# Patient Record
Sex: Male | Born: 1970
Health system: Southern US, Community
[De-identification: ages and names within clinical notes are randomized; demographics above are authoritative.]

## PROBLEM LIST (undated history)

## (undated) HISTORY — PX: HERNIA REPAIR: SHX51

---

## 1991-09-23 HISTORY — PX: CYST REMOVAL TRUNK: SHX6283

## 2010-10-30 ENCOUNTER — Emergency Department: Payer: Self-pay | Admitting: Unknown Physician Specialty

## 2013-01-31 ENCOUNTER — Encounter: Payer: Self-pay | Admitting: Adult Health

## 2013-01-31 ENCOUNTER — Ambulatory Visit (INDEPENDENT_AMBULATORY_CARE_PROVIDER_SITE_OTHER): Payer: BC Managed Care – PPO | Admitting: Adult Health

## 2013-01-31 VITALS — BP 110/68 | HR 74 | Temp 97.9°F | Resp 14 | Ht 67.5 in | Wt 199.5 lb

## 2013-01-31 DIAGNOSIS — R5381 Other malaise: Secondary | ICD-10-CM

## 2013-01-31 DIAGNOSIS — Z0000 Encounter for general adult medical examination without abnormal findings: Secondary | ICD-10-CM

## 2013-01-31 DIAGNOSIS — R5383 Other fatigue: Secondary | ICD-10-CM | POA: Insufficient documentation

## 2013-01-31 LAB — COMPREHENSIVE METABOLIC PANEL
ALT: 52 U/L (ref 0–53)
Albumin: 4.2 g/dL (ref 3.5–5.2)
CO2: 23 mEq/L (ref 19–32)
GFR: 90.94 mL/min (ref >60.00)
Glucose, Bld: 87 mg/dL (ref 70–99)
Potassium: 4.7 mEq/L (ref 3.5–5.1)
Sodium: 137 mEq/L (ref 135–145)
Total Bilirubin: 0.6 mg/dL (ref 0.3–1.2)
Total Protein: 7.5 g/dL (ref 6.0–8.3)

## 2013-01-31 LAB — CBC WITH DIFFERENTIAL/PLATELET
Basophils Absolute: 0 10*3/uL (ref 0.0–0.1)
Eosinophils Absolute: 0.1 10*3/uL (ref 0.0–0.7)
HCT: 45.3 % (ref 39.0–52.0)
Hemoglobin: 15.7 g/dL (ref 13.0–17.0)
Lymphocytes Relative: 23.1 % (ref 12.0–46.0)
Lymphs Abs: 1.3 10*3/uL (ref 0.7–4.0)
MCHC: 34.7 g/dL (ref 30.0–36.0)
Monocytes Relative: 10.1 % (ref 3.0–12.0)
Neutro Abs: 3.6 10*3/uL (ref 1.4–7.7)
Platelets: 204 10*3/uL (ref 150.0–400.0)
RDW: 13.7 % (ref 11.5–14.6)

## 2013-01-31 LAB — TSH: TSH: 1.08 u[IU]/mL (ref 0.35–5.50)

## 2013-01-31 LAB — LIPID PANEL
Cholesterol: 269 mg/dL — ABNORMAL HIGH (ref 0–200)
VLDL: 28.8 mg/dL (ref 0.0–40.0)

## 2013-01-31 NOTE — Patient Instructions (Addendum)
   Thank you for choosing [place] [facility] for your health care needs.  Please have your labs drawn prior to leaving the office.  The results will be available through MyChart for your convenience. Please remember to activate this. The activation code is located at the end of this form.  We will call you once your results are available.

## 2013-01-31 NOTE — Assessment & Plan Note (Signed)
Normal physical exam. Check routine labs: CBC with differential, metabolic panel, thyroid function and lipids.

## 2013-01-31 NOTE — Assessment & Plan Note (Signed)
Fatigue may be secondary to lack of sleep. Also discussed the possibility of sleep apnea as a possibility for fatigue or thyroid dysfunction, anemia. Check TSH and CBC.

## 2013-01-31 NOTE — Progress Notes (Signed)
Subjective:    Patient ID: Todd Miranda, male    DOB: 1971/06/28, 42 y.o.   MRN: [id number]  HPI  Patient is a 42 y/o male who presents to clinic to establish care. Patient has not had a primary care physician. Overall he is feeling quite healthy. He is experiencing some fatigue secondary to lack of sleep. The patient reports "I am unable to turn my brain off".  Family History  Problem Relation Age of Onset  . Cancer Mother     breast cancer     History   Social History  . Marital Status: Single    Spouse Name: N/A    Number of Children: N/A  . Years of Education: N/A   Occupational History  . Not on file.   Social History Main Topics  . Smoking status: Former Smoker    Quit date: 09/22/2005  . Smokeless tobacco: Never Used  . Alcohol Use: 3.6 oz/week    6 Cans of beer per week     Comment: 2-3 drinks per night  . Drug Use: No  . Sexually Active: Not on file   Other Topics Concern  . Not on file   Social History Narrative  . No narrative on file     Health Maintenance:   Tdap - 2011  Flu shot - Never has  Colonoscopy - N/A  Labs - needs  Depression Screen -   Tobacco Use - Remote tobacco. Quit 2007. 1ppd for 15 years  Dental Exams - Has not been  Vision Exam - yearly. [facility]  Exercise - No    Review of Systems  Constitutional: Positive for fatigue. Negative for fever and chills.  HENT: Negative.   Eyes: Negative.   Respiratory: Negative.   Cardiovascular: Negative.   Gastrointestinal: Positive for diarrhea. Negative for nausea and vomiting.       Diarrhea with consuming diet drinks  Endocrine: Negative.   Genitourinary: Negative for dysuria, frequency, hematuria, discharge, scrotal swelling, penile pain and testicular pain.  Musculoskeletal: Negative.   Skin: Negative.   Allergic/Immunologic: Negative for environmental allergies and food allergies.  Neurological: Negative.   Hematological: Negative.   Psychiatric/Behavioral:  Positive for sleep disturbance. Negative for suicidal ideas and self-injury. The patient is nervous/anxious.        Trouble sleeping.     BP 110/68  Pulse 74  Temp(Src) 97.9 F (36.6 C) (Oral)  Resp 14  Ht 5' 7.5" (1.715 m)  Wt 199 lb 8 oz (90.493 kg)  BMI 30.77 kg/m2  SpO2 97%    Objective:   Physical Exam  Constitutional: He is oriented to person, place, and time. He appears well-developed and well-nourished. No distress.  HENT:  Head: Normocephalic and atraumatic.  Right Ear: External ear normal.  Left Ear: External ear normal.  Nose: Nose normal.  Mouth/Throat: Oropharynx is clear and moist.  Eyes: Conjunctivae and EOM are normal. Pupils are equal, round, and reactive to light.  Neck: Normal range of motion. Neck supple. No thyromegaly present.  Cardiovascular: Normal rate, regular rhythm, normal heart sounds and intact distal pulses.  Exam reveals no gallop.   No murmur heard. Pulmonary/Chest: Effort normal and breath sounds normal. No respiratory distress. He has no wheezes. He has no rales. He exhibits no tenderness.  Abdominal: Soft. Bowel sounds are normal. He exhibits no distension and no mass. There is no tenderness. There is no rebound and no guarding.  Musculoskeletal: Normal range of motion. He exhibits no edema and no  tenderness.  Lymphadenopathy:    He has no cervical adenopathy.  Neurological: He is alert and oriented to person, place, and time. He has normal reflexes. No cranial nerve deficit. Coordination normal.  Skin: Skin is warm and dry.  Psychiatric: He has a normal mood and affect. His behavior is normal. Judgment and thought content normal.       Assessment & Plan:

## 2013-02-03 ENCOUNTER — Encounter: Payer: Self-pay | Admitting: *Deleted

## 2013-12-15 ENCOUNTER — Telehealth: Payer: Self-pay | Admitting: Adult Health

## 2013-12-15 NOTE — Telephone Encounter (Signed)
error 

## 2013-12-19 ENCOUNTER — Ambulatory Visit (INDEPENDENT_AMBULATORY_CARE_PROVIDER_SITE_OTHER): Payer: BC Managed Care – PPO | Admitting: Adult Health

## 2013-12-19 ENCOUNTER — Encounter (INDEPENDENT_AMBULATORY_CARE_PROVIDER_SITE_OTHER): Payer: Self-pay

## 2013-12-19 ENCOUNTER — Encounter: Payer: Self-pay | Admitting: Adult Health

## 2013-12-19 VITALS — BP 108/66 | HR 81 | Temp 98.0°F | Resp 12 | Wt 208.0 lb

## 2013-12-19 DIAGNOSIS — M25531 Pain in right wrist: Secondary | ICD-10-CM | POA: Insufficient documentation

## 2013-12-19 DIAGNOSIS — M25539 Pain in unspecified wrist: Secondary | ICD-10-CM

## 2013-12-19 DIAGNOSIS — F411 Generalized anxiety disorder: Secondary | ICD-10-CM

## 2013-12-19 DIAGNOSIS — F419 Anxiety disorder, unspecified: Secondary | ICD-10-CM | POA: Insufficient documentation

## 2013-12-19 MED ORDER — ALPRAZOLAM 0.5 MG PO TABS: 0.5000 mg | ORAL_TABLET | Freq: Every evening | ORAL | Status: DC | PRN

## 2013-12-19 NOTE — Progress Notes (Signed)
Pre visit review using our clinic review tool, if applicable. No additional management support is needed unless otherwise documented below in the visit note. 

## 2013-12-19 NOTE — Patient Instructions (Signed)
  For anxiety and trouble sleeping I am starting you on xanax 0.5 mg at bedtime as needed.  Please let me know if your symptoms are not improved within 1-2 weeks.  For your wrist pain:  Take ibuprofen 800 mg twice a day for the next 1-2 weeks. Take with food.  Apply ice alternating with heat to the affected areas for 15 min at a time. Do this approximately 3-4 times daily.  Wear a wrist splint to keep your wrist in a neutral position.  If no improvement within 1 month please let me know and I will refer you to Orthopedics for further evaluation.

## 2013-12-19 NOTE — Progress Notes (Signed)
Patient ID: Todd Miranda, male   DOB: 1971-06-24, 43 y.o.   MRN: [id number]    Subjective:    Patient ID: Todd RoutJulio C Miranda, male    DOB: 1971-06-24, 43 y.o.   MRN: [id number]  HPI  Pt is a very pleasant 43 y/o male who presents to clinic with a few concerns:  1) Anxiety. He is experiencing increasing stress at work and home. Reports being promoted at work. This is going well. He is having problems with a difficult employee. Feels he has support from his supervisor but still having difficulty communicating effectively with this employee. He is also having problems with his father who is currently living with him. Todd Miranda reports that he is trying to do the right thing with his father. He really never had a good relationship with his father. Reports that his father has always been a difficult person to live with. Todd Miranda left his home at age 43 because of issues with his dad. His father has also put added stress on [place]'s relationship with his girlfriend. They are currently taking some time off. This is also adding additional stress. His dad is getting ready to move to [place] with his daughter. Todd Miranda is having trouble sleeping.  2) Pain in his right wrist. He is right handed. His work with [employer] involves many repetitive movements. Todd Miranda reports a "shooting pain" on the pinky side of his wrist that travels to mid arm. He has been taking ibuprofen at bedtime.  Medical Hx: Non contributory  Current Outpatient Prescriptions on File Prior to Visit  Medication Sig Dispense Refill  . diphenhydrAMINE (BENADRYL) 25 MG tablet Take 25 mg by mouth at bedtime as needed for itching.       No current facility-administered medications on file prior to visit.    Review of Systems  Respiratory: Negative.   Cardiovascular: Negative for chest pain, palpitations and leg swelling.  Gastrointestinal: Negative.   Musculoskeletal:       Right wrist pain  Neurological: Numbness: tingling in right wrist.    Psychiatric/Behavioral: Positive for sleep disturbance. Negative for suicidal ideas, hallucinations, behavioral problems, confusion, self-injury, dysphoric mood, decreased concentration and agitation. The patient is nervous/anxious (Denies depression. Feels down 2/2 everything happening at once). The patient is not hyperactive.   All other systems reviewed and are negative.       Objective:  BP 108/66  Pulse 81  Temp(Src) 98 F (36.7 C) (Oral)  Resp 12  Wt 208 lb (94.348 kg)  SpO2 97%   Physical Exam  Constitutional: He is oriented to person, place, and time. He appears well-developed and well-nourished. No distress.  HENT:  Head: Normocephalic and atraumatic.  Cardiovascular: Normal rate, regular rhythm and normal heart sounds.  Exam reveals no gallop and no friction rub.   No murmur heard. Pulmonary/Chest: Effort normal and breath sounds normal. No respiratory distress. He has no wheezes. He has no rales.  Musculoskeletal: He exhibits no edema and no tenderness.  Positive phalen's test. Negative tinel's  Neurological: He is alert and oriented to person, place, and time. Coordination normal.  Skin: Skin is warm.  Psychiatric: He has a normal mood and affect. His behavior is normal. Judgment and thought content normal.  Expresses himself very well and engaging in conversation. Good eye contact.       Assessment & Plan:   1. Anxiety Multifactorial. Had long discussion with patient. Allowed time for him to "vent" and discuss what was troubling him. Discussed ways to minimize  stress such as exercise (not too close to bedtime), engaging in activities that interest him. Part of his stress is dealing with issues he cannot control. I will start him on xanax to help him relax and sleep. Discussed to use at bedtime, prn. Discussed addictive nature of this medication. If no improvement in 2 weeks we will consider adding an SSRI that may help with anxiety as well.  2. Wrist pain,  right Suspect may be carpal tunnel syndrome. Wrist splint to keep in neutral position. Ibuprofen bid for pain and inflammation. Ice. If no improvement within 1 month I will refer him to Ortho for further evaluation.

## 2014-01-16 ENCOUNTER — Encounter: Payer: Self-pay | Admitting: Adult Health

## 2014-01-16 ENCOUNTER — Ambulatory Visit (INDEPENDENT_AMBULATORY_CARE_PROVIDER_SITE_OTHER): Payer: BC Managed Care – PPO | Admitting: Adult Health

## 2014-01-16 VITALS — BP 112/68 | HR 75 | Temp 98.0°F | Resp 14 | Wt 199.0 lb

## 2014-01-16 DIAGNOSIS — F418 Other specified anxiety disorders: Secondary | ICD-10-CM

## 2014-01-16 DIAGNOSIS — F341 Dysthymic disorder: Secondary | ICD-10-CM

## 2014-01-16 DIAGNOSIS — M25539 Pain in unspecified wrist: Secondary | ICD-10-CM

## 2014-01-16 MED ORDER — FLUOXETINE HCL 20 MG PO TABS: 20.0000 mg | ORAL_TABLET | Freq: Every day | ORAL | Status: DC

## 2014-01-16 MED ORDER — BUSPIRONE HCL 7.5 MG PO TABS: 7.5000 mg | ORAL_TABLET | Freq: Two times a day (BID) | ORAL | Status: DC

## 2014-01-16 NOTE — Patient Instructions (Signed)
  Start Prozac 20 mg daily.  Start taking buspirone 7.5 mg twice a day (also called buspar).  You can continue using the xanax as needed at bedtime. Do not take unless you need.  I want to see you back for follow up in 1 month.  We will call you with an appointment for orthopedic

## 2014-01-16 NOTE — Progress Notes (Signed)
Pre visit review using our clinic review tool, if applicable. No additional management support is needed unless otherwise documented below in the visit note. 

## 2014-01-16 NOTE — Progress Notes (Signed)
Patient ID: Todd Miranda, male   DOB: [birth year], 43 y.o.   MRN: [id number]    Subjective:    Patient ID: Todd Miranda, male    DOB: [birth year], 43 y.o.   MRN: [id number]  HPI  Patient is a pleasant 43 year old male who presents to clinic for followup on anxiety. I saw him back on March 30 when he was experiencing increasing stress at work and home. He is still having considerable amount of stress at home. He recently drove his father to [place] to stay with his sister. His father had been a good portion of his anxiety. Todd Miranda also reports that the relationship with his girlfriend has ended. He was trying to work things out; however, his girlfriend thought that it would be best to part ways. He is having a difficult time with this. He is having difficulty sleeping and has been experiencing what is described as mild panic attacks. He reports that he had been taking 2 of his Xanax just to help him be able to relax to go to sleep. He feels that he needs something in addition to the Xanax because he feels that he is depressed.   He is still experiencing pain in his right wrist. He is right handed. His work with [employer] involves many repetitive movements. Todd Miranda reports a "shooting pain" on the pinky side of his wrist that travels to mid arm. He has been taking ibuprofen at bedtime. He has also been resting the area as much as possible.    No past medical history on file.  Current Outpatient Prescriptions on File Prior to Visit  Medication Sig Dispense Refill  . ALPRAZolam (XANAX) 0.5 MG tablet Take 1 tablet (0.5 mg total) by mouth at bedtime as needed for anxiety.  30 tablet  3  . diphenhydrAMINE (BENADRYL) 25 MG tablet Take 25 mg by mouth at bedtime as needed for itching.       No current facility-administered medications on file prior to visit.     Review of Systems  Constitutional: Negative.   HENT: Negative.   Eyes: Negative.   Respiratory: Negative.   Cardiovascular: Negative.    Gastrointestinal: Negative.   Endocrine: Negative.   Genitourinary: Negative.   Musculoskeletal: Positive for arthralgias (right wrist).  Skin: Negative.   Allergic/Immunologic: Negative.   Neurological: Negative.        Shooting pain right wrist  Hematological: Negative.   Psychiatric/Behavioral: Positive for sleep disturbance. Negative for suicidal ideas, behavioral problems, confusion, self-injury, decreased concentration and agitation. The patient is nervous/anxious (mild depression).        Objective:  BP 112/68  Pulse 75  Temp(Src) 98 F (36.7 C) (Oral)  Resp 14  Wt 199 lb (90.266 kg)  SpO2 97%   Physical Exam  Constitutional: He is oriented to person, place, and time. He appears well-developed and well-nourished. No distress.  HENT:  Head: Normocephalic and atraumatic.  Eyes: Conjunctivae and EOM are normal.  Neck: Neck supple.  Cardiovascular: Normal rate, regular rhythm, normal heart sounds and intact distal pulses.  Exam reveals no gallop and no friction rub.   No murmur heard. Pulmonary/Chest: Effort normal. No respiratory distress.  Musculoskeletal:  Pain in right wrist with flexion and extension. Also discomfort with rotation of the wrist (pronation, supination)   Neurological: He is alert and oriented to person, place, and time. Coordination normal.  Skin: Skin is warm and dry.  Psychiatric: He has a normal mood and affect. His behavior is normal. Judgment  and thought content normal.      Assessment & Plan:   1. Pain, wrist Suspect carpel tunnel given his work hx and repetitive movements. Refer to Ortho. Would like to stay in [place] if possible. - Ambulatory referral to Orthopedic Surgery  2. Depression w/anxiety He is still experiencing considerable amount of anxiety and now some panic attacks. Discussed starting BuSpar 7.5 mg twice a day. We can increase the dose if necessary. We can also increase the frequency if necessary as well. I will also start  him on Prozac 20 milligrams daily to help with depression. He can continue Xanax when necessary at bedtime. Discussed that I would prefer that he only takes the Xanax as needed. If he needs additional help with anxiety, as mentioned above, we can increase the BuSpar. Followup in one month.

## 2014-02-20 ENCOUNTER — Ambulatory Visit: Payer: BC Managed Care – PPO | Admitting: Adult Health

## 2014-02-27 ENCOUNTER — Encounter: Payer: Self-pay | Admitting: Adult Health

## 2014-02-27 ENCOUNTER — Ambulatory Visit (INDEPENDENT_AMBULATORY_CARE_PROVIDER_SITE_OTHER): Payer: BC Managed Care – PPO | Admitting: Adult Health

## 2014-02-27 VITALS — BP 118/72 | HR 80 | Temp 97.9°F | Resp 14 | Wt 183.0 lb

## 2014-02-27 DIAGNOSIS — G479 Sleep disorder, unspecified: Secondary | ICD-10-CM

## 2014-02-27 DIAGNOSIS — F341 Dysthymic disorder: Secondary | ICD-10-CM

## 2014-02-27 DIAGNOSIS — F418 Other specified anxiety disorders: Secondary | ICD-10-CM

## 2014-02-27 MED ORDER — TRAZODONE HCL 50 MG PO TABS: 25.0000 mg | ORAL_TABLET | Freq: Every evening | ORAL | Status: DC | PRN

## 2014-02-27 NOTE — Progress Notes (Signed)
Pre visit review using our clinic review tool, if applicable. No additional management support is needed unless otherwise documented below in the visit note. 

## 2014-02-27 NOTE — Patient Instructions (Addendum)
  Start Trazodone 50 mg at bedtime for sleep.  Continue the prozac and buspar as you have been doing.  Continue exercising as you have been doing.  Stay social.  Return in August for your physical exam. You will need to be fasting.

## 2014-02-27 NOTE — Progress Notes (Signed)
Patient ID: Todd Miranda, male   DOB: July 16, 1971, 43 y.o.   MRN: [id number]   Subjective:    Patient ID: Todd RoutJulio C Miranda, male    DOB: July 16, 1971, 43 y.o.   MRN: [id number]  HPI  Pt is a very pleasant 43 y/o male who presents to clinic for follow up depression with anxiety. He was seen 1 month ago and c/o experiencing considerable amount of anxiety and panic attacks. He was started on Buspar 7.5 mg twice a day. I also started him on Prozac 20 milligrams daily to help with depression.   He recently broke off a 2 year relationship with his ex-girlfriend. He reports that she has gotten everything out of his house. This was a hard time for Todd Miranda as he was trying to work things out. He reports that he saw her with another man approximately 1 month ago. He felt she was not honest with him and was possibly seeing another man while she was with him. He is slowly beginning to feel better. Has very supportive friends. He is staying active socially and is exercising regularly. He has changed his eating habits as well. He has lost 30 lbs. Physically, he feels the best he has in many years. Emotionally he feels he is significantly improvement.  Buspar is working. Prozac is also helping. Having some problems sleeping. He was taking xanax to help him sleep but he would prefer to try something else.  His father moved back to [place] and this has further decreased his stress level.    No past medical history on file.  Current Outpatient Prescriptions on File Prior to Visit  Medication Sig Dispense Refill  . ALPRAZolam (XANAX) 0.5 MG tablet Take 1 tablet (0.5 mg total) by mouth at bedtime as needed for anxiety.  30 tablet  3  . busPIRone (BUSPAR) 7.5 MG tablet Take 1 tablet (7.5 mg total) by mouth 2 (two) times daily.  60 tablet  5  . diphenhydrAMINE (BENADRYL) 25 MG tablet Take 25 mg by mouth at bedtime as needed for itching.      Marland Kitchen. FLUoxetine (PROZAC) 20 MG tablet Take 1 tablet (20 mg total) by mouth  daily.  30 tablet  3   No current facility-administered medications on file prior to visit.    Review of Systems  Constitutional: Negative.   HENT: Negative.   Eyes: Negative.   Respiratory: Negative.   Cardiovascular: Negative.   Gastrointestinal: Negative.   Endocrine: Negative.   Genitourinary: Negative.   Musculoskeletal: Negative.   Skin: Negative.   Allergic/Immunologic: Negative.   Neurological: Negative.   Hematological: Negative.   Psychiatric/Behavioral: Positive for sleep disturbance. Negative for suicidal ideas, behavioral problems, confusion, dysphoric mood and decreased concentration. The patient is nervous/anxious (improved with buspar).        Depression improved with prozac, exercise and staying social with friends       Objective:  BP 118/72  Pulse 80  Temp(Src) 97.9 F (36.6 C) (Oral)  Resp 14  Wt 183 lb (83.008 kg)  SpO2 97%   Physical Exam  Constitutional: He is oriented to person, place, and time. He appears well-developed and well-nourished. No distress.  Appears very calm. He has lost 30 lbs with exercise.  HENT:  Head: Normocephalic and atraumatic.  Eyes: Conjunctivae and EOM are normal.  Neck: Neck supple.  Cardiovascular: Normal rate and regular rhythm.   Pulmonary/Chest: Effort normal. No respiratory distress.  Musculoskeletal: Normal range of motion.  Neurological: He is alert  and oriented to person, place, and time.  Skin: Skin is warm and dry.  Psychiatric: He has a normal mood and affect. His behavior is normal. Judgment and thought content normal.  Good eye contact. Engaging in conversation      Assessment & Plan:   1. Depression with anxiety Doing well with Prozac and Buspar. Less anxious. He is staying active. Exercising regularly. Has very good support group of friends. Continue to follow. He will return for his yearly physical exam in August. Spent 30 min in face to face communication allowing him time to express his feelings  and discuss ways he has been managing his anxiety. It has been a tough time for him but he has found very healthy ways to help him cope.  2. Sleep disturbance Trazodone 50 mg at bedtime. He prefers to try something other than the xanax. He will let me know if trazodone is working. Continue to follow.

## 2014-05-01 ENCOUNTER — Encounter: Payer: BC Managed Care – PPO | Admitting: Adult Health

## 2014-08-09 ENCOUNTER — Other Ambulatory Visit: Payer: Self-pay | Admitting: *Deleted

## 2014-08-09 MED ORDER — FLUOXETINE HCL 20 MG PO TABS: 20.0000 mg | ORAL_TABLET | Freq: Every day | ORAL | Status: DC

## 2014-08-09 MED ORDER — BUSPIRONE HCL 7.5 MG PO TABS: 7.5000 mg | ORAL_TABLET | Freq: Two times a day (BID) | ORAL | Status: DC

## 2014-08-09 MED ORDER — TRAZODONE HCL 50 MG PO TABS: 25.0000 mg | ORAL_TABLET | Freq: Every evening | ORAL | Status: DC | PRN

## 2014-08-11 ENCOUNTER — Other Ambulatory Visit: Payer: Self-pay

## 2014-08-11 MED ORDER — FLUOXETINE HCL 20 MG PO TABS: 20.0000 mg | ORAL_TABLET | Freq: Every day | ORAL | Status: DC

## 2014-08-11 MED ORDER — BUSPIRONE HCL 7.5 MG PO TABS: 7.5000 mg | ORAL_TABLET | Freq: Two times a day (BID) | ORAL | Status: DC

## 2014-08-11 MED ORDER — TRAZODONE HCL 50 MG PO TABS: 25.0000 mg | ORAL_TABLET | Freq: Every evening | ORAL | Status: DC | PRN

## 2014-08-11 NOTE — Telephone Encounter (Signed)
Request for 90day supply for rx's sent by [facility]. Called [facility] to cancel the Rx's sent to them. Patient had not picked up Rx's yet so they were able to cancel them.

## 2014-08-28 ENCOUNTER — Encounter: Payer: BC Managed Care – PPO | Admitting: Nurse Practitioner

## 2014-10-02 ENCOUNTER — Encounter: Payer: Self-pay | Admitting: Nurse Practitioner

## 2014-10-02 ENCOUNTER — Ambulatory Visit (INDEPENDENT_AMBULATORY_CARE_PROVIDER_SITE_OTHER): Payer: BLUE CROSS/BLUE SHIELD | Admitting: Nurse Practitioner

## 2014-10-02 VITALS — BP 122/70 | HR 89 | Temp 98.2°F | Resp 12 | Ht 67.5 in | Wt 204.1 lb

## 2014-10-02 DIAGNOSIS — E669 Obesity, unspecified: Secondary | ICD-10-CM | POA: Insufficient documentation

## 2014-10-02 DIAGNOSIS — G479 Sleep disorder, unspecified: Secondary | ICD-10-CM

## 2014-10-02 DIAGNOSIS — Z0000 Encounter for general adult medical examination without abnormal findings: Secondary | ICD-10-CM

## 2014-10-02 DIAGNOSIS — F419 Anxiety disorder, unspecified: Secondary | ICD-10-CM

## 2014-10-02 DIAGNOSIS — M25531 Pain in right wrist: Secondary | ICD-10-CM

## 2014-10-02 LAB — TSH: TSH: 0.77 u[IU]/mL (ref 0.35–4.50)

## 2014-10-02 LAB — CBC WITH DIFFERENTIAL/PLATELET
BASOS PCT: 0.3 % (ref 0.0–3.0)
Basophils Absolute: 0 10*3/uL (ref 0.0–0.1)
EOS PCT: 1.9 % (ref 0.0–5.0)
Eosinophils Absolute: 0.1 10*3/uL (ref 0.0–0.7)
HEMATOCRIT: 42.2 % (ref 39.0–52.0)
Hemoglobin: 14.3 g/dL (ref 13.0–17.0)
LYMPHS ABS: 1 10*3/uL (ref 0.7–4.0)
Lymphocytes Relative: 17.7 % (ref 12.0–46.0)
MCHC: 33.8 g/dL (ref 30.0–36.0)
MCV: 89.7 fl (ref 78.0–100.0)
MONO ABS: 0.7 10*3/uL (ref 0.1–1.0)
MONOS PCT: 11.3 % (ref 3.0–12.0)
NEUTROS ABS: 4 10*3/uL (ref 1.4–7.7)
Neutrophils Relative %: 68.8 % (ref 43.0–77.0)
Platelets: 221 10*3/uL (ref 150.0–400.0)
RBC: 4.71 Mil/uL (ref 4.22–5.81)
RDW: 13.1 % (ref 11.5–15.5)
WBC: 5.8 10*3/uL (ref 4.0–10.5)

## 2014-10-02 MED ORDER — ALPRAZOLAM 0.5 MG PO TABS: ORAL_TABLET | ORAL | Status: DC

## 2014-10-02 MED ORDER — ALPRAZOLAM 0.5 MG PO TABS: 0.5000 mg | ORAL_TABLET | Freq: Every evening | ORAL | Status: DC | PRN

## 2014-10-02 NOTE — Progress Notes (Signed)
Subjective:    Patient ID: Todd Miranda, male    DOB: 1971-01-04, 44 y.o.   MRN: [id number]  HPI  Todd Miranda is a 44 yo male with a CC of left wrist pain and a refill for xanax for anxiety. He is here for his annual physical.   1) Left wrist pain around wrist x 2 weeks, painful when picking up weighted objects. Denies numbness/tingling, painful on radial side of wrist and thumb. Pt has brace at home, but it impedes ability to do job.   Ibuprofen- helpful   2)  Headaches- Chronic issue, happens on left side of head   Ibuprofen and eating helpful  3) Sleep disturbance- Trazadone 0.5 mg each night helpful  4) Anxiety- father being in the house triggers it, he has since gone to [place] and the anxiety is lessened, but still there.   5) Health Maintenance- Exercises- cardio 6 x a week for a hour starting 10/02/24 for 90 days -[employer] Diet- Follow diet plan through [employer].  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 A dentist.   Review of Systems  Constitutional: Negative for fever, chills, diaphoresis and fatigue.  HENT: Positive for trouble swallowing. Negative for tinnitus.        Dry mouth  Eyes: Negative for visual disturbance.  Respiratory: Negative for cough, chest tightness, shortness of breath and wheezing.   Cardiovascular: Negative for chest pain, palpitations and leg swelling.  Gastrointestinal: Negative for nausea, vomiting, diarrhea and constipation.  Musculoskeletal: Positive for arthralgias. Negative for back pain.  Skin: Negative for rash and wound.  Neurological: Negative for dizziness.  Psychiatric/Behavioral: Positive for decreased concentration. Negative for suicidal ideas and sleep disturbance. The patient is nervous/anxious.    No past medical history on file.  History   Social History  . Marital  Status: Single    Spouse Name: N/A    Number of Children: N/A  . Years of Education: N/A   Occupational History  . Not on file.   Social History Main Topics  . Smoking status: Former Smoker    Quit date: 09/22/2005  . Smokeless tobacco: Never Used  . Alcohol Use: 3.6 oz/week    6 Cans of beer per week     Comment: 2-3 drinks per night  . Drug Use: No  . Sexual Activity: Not on file   Other Topics Concern  . Not on file   Social History Narrative    Past Surgical History  Procedure Laterality Date  . Hernia repair      age 89 and 89  . Cyst removal trunk  1993    tailbone    Family History  Problem Relation Age of Onset  . Cancer Mother     breast cancer    No Known Allergies  Current Outpatient Prescriptions on File Prior to Visit  Medication Sig Dispense Refill  . ALPRAZolam (XANAX) 0.5 MG tablet Take 1 tablet (0.5 mg total) by mouth at bedtime as needed for anxiety. 30 tablet 3  . busPIRone (BUSPAR) 7.5 MG tablet Take 1 tablet (7.5 mg total) by mouth 2 (two) times daily. 180 tablet 0  . FLUoxetine (PROZAC) 20 MG tablet Take 1 tablet (20 mg total) by mouth daily. 90 tablet 0  . traZODone (DESYREL) 50 MG tablet Take 0.5-1 tablets (25-50 mg  total) by mouth at bedtime as needed for sleep. 90 tablet 0   No current facility-administered medications on file prior to visit.       Objective:   Physical Exam  Constitutional: He is oriented to person, place, and time. He appears well-developed and well-nourished. No distress.  HENT:  Head: Normocephalic and atraumatic.  Right Ear: External ear normal.  Left Ear: External ear normal.  Nose: Nose normal.  Mouth/Throat: Oropharynx is clear and moist. No oropharyngeal exudate.  Eyes: Conjunctivae and EOM are normal. Pupils are equal, round, and reactive to light. Right eye exhibits no discharge. Left eye exhibits no discharge. No scleral icterus.  Neck: Normal range of motion. Neck supple. No thyromegaly present.    Cardiovascular: Normal rate, regular rhythm, normal heart sounds and intact distal pulses.  Exam reveals no gallop and no friction rub.   No murmur heard. Pulmonary/Chest: Effort normal and breath sounds normal. No respiratory distress. He has no wheezes. He has no rales. He exhibits no tenderness.  Abdominal: Soft. Bowel sounds are normal. He exhibits no distension and no mass. There is no tenderness. There is no rebound and no guarding.  Genitourinary:  Deferred  Musculoskeletal: Normal range of motion. He exhibits tenderness. He exhibits no edema.  Left wrist positive finkelsteins.   Lymphadenopathy:    He has no cervical adenopathy.  Neurological: He is alert and oriented to person, place, and time. He displays normal reflexes. No cranial nerve deficit. He exhibits normal muscle tone. Coordination normal.  Skin: Skin is warm and dry. No rash noted. He is not diaphoretic.  Psychiatric: He has a normal mood and affect. His behavior is normal. Judgment and thought content normal.   BP 122/70 mmHg  Pulse 89  Temp(Src) 98.2 F (36.8 C) (Oral)  Resp 12  Ht 5' 7.5" (1.715 m)  Wt 204 lb 1.9 oz (92.588 kg)  BMI 31.48 kg/m2  SpO2 98%     Assessment & Plan:

## 2014-10-02 NOTE — Progress Notes (Signed)
Pre visit review using our clinic review tool, if applicable. No additional management support is needed unless otherwise documented below in the visit note. 

## 2014-10-02 NOTE — Patient Instructions (Signed)
Please visit the lab before leaving today.   De Quervain's Disease Suzette BattiestDe Quervain's disease is a condition often seen in racquet sports where there is a soreness (inflammation) in the cord like structures (tendons) which attach muscle to bone on the thumb side of the wrist. There may be a tightening of the tissuesaround the tendons. This condition is often helped by giving up or modifying the activity which caused it. When conservative treatment does not help, surgery may be required. Conservative treatment could include changes in the activity which brought about the problem or made it worse. Anti-inflammatory medications and injections may be used to help decrease the inflammation and help with pain control. Your caregiver will help you determine which is best for you. DIAGNOSIS  Often the diagnosis (learning what is wrong) can be made by examination. Sometimes x-rays are required. HOME CARE INSTRUCTIONS   Apply ice to the sore area for 15-20 minutes, 03-04 times per day while awake. Put the ice in a plastic bag and place a towel between the bag of ice and your skin. This is especially helpful if it can be done after all activities involving the sore wrist.  Temporary splinting may help.  Only take over-the-counter or prescription medicines for pain, discomfort or fever as directed by your caregiver. SEEK MEDICAL CARE IF:   Pain relief is not obtained with medications, or if you have increasing pain and seem to be getting worse rather than better. MAKE SURE YOU:   Understand these instructions.  Will watch your condition.  Will get help right away if you are not doing well or get worse. Document Released: 06/03/2001 Document Revised: 12/01/2011 Document Reviewed: 01/11/2014 [facility] Patient Information 2015 [employer] [place]. This information is not intended to replace advice given to you by your health care provider. Make sure you discuss any questions you have with your health care  provider.

## 2014-10-03 LAB — COMPREHENSIVE METABOLIC PANEL
ALT: 47 U/L (ref 0–53)
AST: 33 U/L (ref 0–37)
Albumin: 4.2 g/dL (ref 3.5–5.2)
Alkaline Phosphatase: 60 U/L (ref 39–117)
BUN: 21 mg/dL (ref 6–23)
CHLORIDE: 103 meq/L (ref 96–112)
CO2: 25 meq/L (ref 19–32)
Calcium: 9.4 mg/dL (ref 8.4–10.5)
Creatinine, Ser: 1.2 mg/dL (ref 0.4–1.5)
GFR: 74.11 mL/min (ref >60.00)
Glucose, Bld: 100 mg/dL — ABNORMAL HIGH (ref 70–99)
POTASSIUM: 4.2 meq/L (ref 3.5–5.1)
Sodium: 136 mEq/L (ref 135–145)
Total Bilirubin: 0.5 mg/dL (ref 0.2–1.2)
Total Protein: 7.1 g/dL (ref 6.0–8.3)

## 2014-10-03 LAB — LIPID PANEL
CHOL/HDL RATIO: 4
CHOLESTEROL: 292 mg/dL — AB (ref 0–200)
HDL: 66.5 mg/dL (ref >39.00)
NonHDL: 225.5
Triglycerides: 455 mg/dL — ABNORMAL HIGH (ref 0.0–149.0)
VLDL: 91 mg/dL — AB (ref 0.0–40.0)

## 2014-10-03 LAB — LDL CHOLESTEROL, DIRECT: Direct LDL: 157.5 mg/dL

## 2014-10-03 NOTE — Assessment & Plan Note (Signed)
Improving. Rx for alprazolam 0.5 mg once daily at bed time for anxiety. 1 prescription for Jan. And 1 for feb. With fill on or after dates. FU prn.

## 2014-10-03 NOTE — Assessment & Plan Note (Signed)
Stable, probable De Quervain's tenosynovitis. Pt given hand out with information about care. Rest, ice, and bracing when able. FU prn.

## 2014-10-03 NOTE — Assessment & Plan Note (Signed)
Stable- discussed plans for diet and exercise.

## 2014-10-03 NOTE — Assessment & Plan Note (Signed)
Obtain TSH, Lipid, CMET, CBC w/ diff today. FU prn.

## 2014-10-03 NOTE — Assessment & Plan Note (Signed)
Stable on trazadone 50 mg daily.

## 2014-10-04 ENCOUNTER — Other Ambulatory Visit: Payer: Self-pay | Admitting: Nurse Practitioner

## 2014-10-04 MED ORDER — ATORVASTATIN CALCIUM 10 MG PO TABS: 10.0000 mg | ORAL_TABLET | Freq: Every day | ORAL | Status: DC

## 2014-10-04 NOTE — Progress Notes (Signed)
Called patient and notified.  Pt verbalized understanding.

## 2014-12-11 ENCOUNTER — Other Ambulatory Visit: Payer: Self-pay | Admitting: Internal Medicine

## 2015-02-07 ENCOUNTER — Telehealth: Payer: Self-pay

## 2015-02-07 NOTE — Telephone Encounter (Signed)
PAtient was started on Alprazolam at last visit three months ago, he is requesting to have the dose increased to 1mg  from .5mg ? Please advise refill for dose?

## 2015-02-07 NOTE — Telephone Encounter (Signed)
The patient called and is hoping to get a refill of alprazolam  Pharmacy - [facility] in [place]  Pt callback [phone]- [phone]  (he is also hoping to increase the dosage to 1mg  instead .5

## 2015-02-08 NOTE — Telephone Encounter (Signed)
The patient called back hoping to get an update on this situation.

## 2015-02-08 NOTE — Telephone Encounter (Signed)
I will refill for Xanax 0.5 twice daily # 60 no refills. If he wants 1 mg he will have to come in and be seen. Thanks!

## 2015-02-09 ENCOUNTER — Other Ambulatory Visit: Payer: Self-pay

## 2015-02-09 ENCOUNTER — Other Ambulatory Visit: Payer: Self-pay | Admitting: Nurse Practitioner

## 2015-02-09 MED ORDER — ALPRAZOLAM 0.5 MG PO TABS: 0.5000 mg | ORAL_TABLET | Freq: Every evening | ORAL | Status: DC | PRN

## 2015-02-09 NOTE — Telephone Encounter (Signed)
Attempted to call patient regarding refill, not able to leave a VM as the mailbox was full.  [facility] for [facility] up.  If patient wants increased dose needs an appt with Lyla Sonarrie.

## 2015-02-13 NOTE — Telephone Encounter (Signed)
Patient returned call, patient requesting to increase dose of Xanax, explained he would need to see Lyla SonCarrie to do that, made appointment on 02/26/15 at 930 to follow up.

## 2015-02-26 ENCOUNTER — Encounter: Payer: Self-pay | Admitting: Nurse Practitioner

## 2015-02-26 ENCOUNTER — Ambulatory Visit (INDEPENDENT_AMBULATORY_CARE_PROVIDER_SITE_OTHER): Payer: BLUE CROSS/BLUE SHIELD | Admitting: Nurse Practitioner

## 2015-02-26 VITALS — BP 102/70 | HR 87 | Temp 97.5°F | Resp 12 | Ht 67.5 in | Wt 211.8 lb

## 2015-02-26 DIAGNOSIS — F418 Other specified anxiety disorders: Secondary | ICD-10-CM

## 2015-02-26 MED ORDER — ALPRAZOLAM 1 MG PO TABS: 1.0000 mg | ORAL_TABLET | Freq: Two times a day (BID) | ORAL | Status: DC | PRN

## 2015-02-26 NOTE — Patient Instructions (Addendum)
Please continue to follow up with your counselor. Follow up with me in 3 months for anxiety.   Do not hesitate to call us or the hotline if you have any thoughts again.

## 2015-02-26 NOTE — Progress Notes (Signed)
   Subjective:    Patient ID: Todd Miranda, male    DOB: 09-27-1970, 44 y.o.   MRN: [id number]  HPI  Todd Miranda is a 44 yo male with a CC of wanting alprazolam dosage increased.   1) Pt reports it was working well until about April. He is having a lot of family and work stressors. 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 He also reports his dad moved in around April and he is dealing with his depression. His father seems to be the biggest stressor since he is currently living with him.   He feels financial stress because he is helping his dad and sister financially. He recently had a relationship end because the partner reported he was not trusting nor "hiself around his father". He reports some "dark thoughts" and called a 1-800 hotline to seek care. He is getting [facility] up [facility]. He denies making a plan or any recent thoughts of suicide/homicide. He reports he has much to live for including being there for his friends.   He is taking 2 of the Xanax 0.5 mg at once to help "take the edge off" of his anxiety. He is also reporting smoking again and drinking liquor in the evenings. He denies destructive behaviors or "being black out drunk". He feels it helps him escape.   Review of Systems  Constitutional: Negative for fever, chills, diaphoresis and fatigue.  Psychiatric/Behavioral: Positive for sleep disturbance. Negative for suicidal ideas, hallucinations, behavioral problems, self-injury and agitation. The patient is nervous/anxious.       Objective:   Physical Exam  Constitutional: He appears well-developed and well-nourished. No distress.  BP 102/70 mmHg  Pulse 87  Temp(Src) 97.5 F (36.4 C) (Oral)  Resp 12  Wt 211 lb 12.8 oz (96.072 kg)  SpO2 97%   Skin: He is not diaphoretic.  Psychiatric: His speech is normal and behavior is normal. Judgment and thought content normal. His mood appears  anxious. His affect is not angry, not blunt, not labile and not inappropriate. Cognition and memory are normal. He exhibits a depressed mood.  He is tearful, but making appropriate eye contact and has insight into why he is stressed.       Assessment & Plan:   I spent 27 minutes face to face with the patient greater than 50% of time spent on coordination of care and counseling on anxiety and depression, medications, when to seek help, and obtaining counseling through psychology.

## 2015-02-26 NOTE — Assessment & Plan Note (Signed)
Recent worsening depression and anxiety. He is seeking counseling currently. He would like an increase in his xanax today since he is taking 1 mg daily himself. UDS obtained and script faxed to pharmacy. FU in 3 months or sooner if he wishes. He verbally agreed he will call us, a hotline, his counselor, or any other care if he has thoughts of suicide.

## 2015-02-26 NOTE — Progress Notes (Signed)
Pre visit review using our clinic review tool, if applicable. No additional management support is needed unless otherwise documented below in the visit note. 

## 2015-04-23 ENCOUNTER — Ambulatory Visit (INDEPENDENT_AMBULATORY_CARE_PROVIDER_SITE_OTHER): Payer: BLUE CROSS/BLUE SHIELD | Admitting: Nurse Practitioner

## 2015-04-23 ENCOUNTER — Encounter: Payer: Self-pay | Admitting: Nurse Practitioner

## 2015-04-23 ENCOUNTER — Encounter (INDEPENDENT_AMBULATORY_CARE_PROVIDER_SITE_OTHER): Payer: Self-pay

## 2015-04-23 VITALS — BP 110/78 | HR 78 | Temp 98.3°F | Resp 18 | Ht 68.0 in | Wt 203.4 lb

## 2015-04-23 DIAGNOSIS — B009 Herpesviral infection, unspecified: Secondary | ICD-10-CM

## 2015-04-23 MED ORDER — VALACYCLOVIR HCL 500 MG PO TABS: 500.0000 mg | ORAL_TABLET | Freq: Two times a day (BID) | ORAL | Status: DC

## 2015-04-23 NOTE — Patient Instructions (Signed)
At first sign of cold sore you can start the medication.   See you in Sept.

## 2015-04-23 NOTE — Assessment & Plan Note (Signed)
Pt has hx of HSV 1 with cold sores happening occasionally < 6 a year. Pt reports + for HSV 2 in past without outbreaks. He is using a condom with his new girlfriend, but understands any contact can cause an infection. We discussed treatment options and he would like to have episodic therapy. Valtrex 500 mg x 3 days at first sign of any outbreak. FU prn worsening/failure to improve.

## 2015-04-23 NOTE — Progress Notes (Signed)
Pre visit review using our clinic review tool, if applicable. No additional management support is needed unless otherwise documented below in the visit note. 

## 2015-04-23 NOTE — Progress Notes (Signed)
   Subjective:    Patient ID: Todd Miranda, male    DOB: [birth year], 44 y.o.   MRN: [id number]  HPI  Mr. Kant is a 44 yo male with a CC of HSV medication discussion.   1) HSV- history of HSV 1  New girlfriend and she had a scare and they had a discussion about his status. She tested negative for all HSV. He reports he was tested after his last girlfriend and was positive for HSV 2, but has never had an outbreak.   2) Down 8 lbs  Wt Readings from Last 3 Encounters:  04/23/15 203 lb 6.4 oz (92.262 kg)  02/26/15 211 lb 12.8 oz (96.072 kg)  10/02/14 204 lb 1.9 oz (92.588 kg)   Review of Systems  Constitutional: Negative for fever, chills, diaphoresis and fatigue.  Eyes: Negative for visual disturbance.  Respiratory: Negative for chest tightness, shortness of breath and wheezing.   Cardiovascular: Negative for chest pain, palpitations and leg swelling.  Neurological: Negative for dizziness, weakness and numbness.  Psychiatric/Behavioral: The patient is not nervous/anxious.       Objective:   Physical Exam  Constitutional: He is oriented to person, place, and time. He appears well-developed and well-nourished. No distress.  BP 110/78 mmHg  Pulse 78  Temp(Src) 98.3 F (36.8 C)  Resp 18  Ht  (1.727 m)  Wt 203 lb 6.4 oz (92.262 kg)  BMI 30.93 kg/m2  SpO2 96%   HENT:  Head: Normocephalic and atraumatic.  Right Ear: External ear normal.  Left Ear: External ear normal.  Neurological: He is alert and oriented to person, place, and time.  Skin: Skin is warm and dry. No rash noted. He is not diaphoretic.  Psychiatric: He has a normal mood and affect. His behavior is normal. Judgment and thought content normal.      Assessment & Plan:

## 2015-06-04 ENCOUNTER — Ambulatory Visit: Payer: BLUE CROSS/BLUE SHIELD | Admitting: Nurse Practitioner

## 2015-07-15 ENCOUNTER — Other Ambulatory Visit: Payer: Self-pay | Admitting: Nurse Practitioner

## 2015-07-16 NOTE — Telephone Encounter (Signed)
Last OV 8.1.16, Ov 9.12.16 was cancelled, no OV scheduled. Please advise refill

## 2015-07-28 ENCOUNTER — Other Ambulatory Visit: Payer: Self-pay | Admitting: Nurse Practitioner

## 2015-07-30 NOTE — Telephone Encounter (Signed)
Refill on Xanax 

## 2015-10-13 ENCOUNTER — Other Ambulatory Visit: Payer: Self-pay | Admitting: Nurse Practitioner

## 2015-10-13 NOTE — Telephone Encounter (Signed)
Okay to refill? Last seen in August. No future appt scheduled

## 2015-11-12 ENCOUNTER — Encounter: Payer: BLUE CROSS/BLUE SHIELD | Admitting: Nurse Practitioner

## 2015-11-30 ENCOUNTER — Encounter: Payer: 59 | Admitting: Nurse Practitioner

## 2015-12-10 ENCOUNTER — Encounter: Payer: 59 | Admitting: Nurse Practitioner

## 2016-01-07 ENCOUNTER — Encounter: Payer: Self-pay | Admitting: Nurse Practitioner

## 2016-01-07 ENCOUNTER — Ambulatory Visit (INDEPENDENT_AMBULATORY_CARE_PROVIDER_SITE_OTHER): Payer: Managed Care, Other (non HMO) | Admitting: Nurse Practitioner

## 2016-01-07 VITALS — BP 121/72 | HR 90 | Temp 98.3°F | Ht 68.0 in | Wt 219.0 lb

## 2016-01-07 DIAGNOSIS — F418 Other specified anxiety disorders: Secondary | ICD-10-CM

## 2016-01-07 MED ORDER — FLUOXETINE HCL 40 MG PO CAPS: 40.0000 mg | ORAL_CAPSULE | Freq: Every day | ORAL | Status: DC

## 2016-01-07 MED ORDER — ALPRAZOLAM 1 MG PO TABS: 1.0000 mg | ORAL_TABLET | Freq: Two times a day (BID) | ORAL | Status: DC | PRN

## 2016-01-07 MED ORDER — TRAZODONE HCL 50 MG PO TABS: ORAL_TABLET | ORAL | Status: DC

## 2016-01-07 MED ORDER — BUSPIRONE HCL 7.5 MG PO TABS: 7.5000 mg | ORAL_TABLET | Freq: Two times a day (BID) | ORAL | Status: DC

## 2016-01-07 NOTE — Patient Instructions (Signed)
See you in 1 week for a physical and fasting labs (nothing to eat or drink after midnight: except water or black coffee -no cream or sugar!)

## 2016-01-07 NOTE — Progress Notes (Signed)
Pre visit review using our clinic review tool, if applicable. No additional management support is needed unless otherwise documented below in the visit note. 

## 2016-01-07 NOTE — Progress Notes (Signed)
Patient ID: Todd Miranda, male    DOB: 02-Sep-1971  Age: 45 y.o. MRN: [id number]  CC: Annual Exam   HPI Todd RoutJulio C Brophy presents for follow up of anxiety.   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   History Todd Miranda has no past medical history on file.   He has past surgical history that includes Hernia repair and Cyst removal trunk (1993).   His family history includes Cancer in his mother.He reports that he has been smoking Cigarettes.  He started smoking about 12 months ago. He has been smoking about 1.00 pack per day. He has never used smokeless tobacco. He reports that he drinks about 3.6 oz of alcohol per week. He reports that he does not use illicit drugs.  Outpatient Prescriptions Prior to Visit  Medication Sig Dispense Refill  . atorvastatin (LIPITOR) 10 MG tablet Take 1 tablet (10 mg total) by mouth daily. 90 tablet 0  . valACYclovir (VALTREX) 500 MG tablet Take 1 tablet (500 mg total) by mouth 2 (two) times daily. 6 tablet 2  . ALPRAZolam (XANAX) 1 MG tablet TAKE ONE TABLET BY MOUTH TWICE DAILY AS NEEDED FOR ANXIETY 60 tablet 0  . busPIRone (BUSPAR) 7.5 MG tablet TAKE 1 TABLET TWICE A DAY 180 tablet 0  . FLUoxetine (PROZAC) 20 MG capsule TAKE 1 CAPSULE DAILY 90 capsule 0  . traZODone (DESYREL) 50 MG tablet TAKE ONE-HALF (1/2) TO ONE TABLET (25 TO 50 MG TOTAL) AT BEDTIME AS NEEDED FOR SLEEP 90 tablet 1   No facility-administered medications prior to visit.    ROS Review of Systems  Constitutional: Negative for fever, chills, diaphoresis and fatigue.  Eyes: Negative for visual disturbance.  Respiratory: Negative for chest tightness, shortness of breath and wheezing.   Cardiovascular: Negative for chest pain,  palpitations and leg swelling.  Gastrointestinal: Negative for nausea, vomiting and diarrhea.  Musculoskeletal: Negative for myalgias.  Skin: Negative for rash.  Neurological: Negative for dizziness, weakness, numbness and headaches.  Psychiatric/Behavioral: Positive for sleep disturbance. The patient is nervous/anxious.     Objective:  BP 121/72 mmHg  Pulse 90  Temp(Src) 98.3 F (36.8 C) (Oral)  Ht 5\' 8"  (1.727 m)  Wt 219 lb (99.338 kg)  BMI 33.31 kg/m2  SpO2 96%  Physical Exam  Constitutional: He is oriented to person, place, and time. He appears well-developed and well-nourished. No distress.  HENT:  Head: Normocephalic and atraumatic.  Right Ear: External ear normal.  Left Ear: External ear normal.  Nose: Nose normal.  Mouth/Throat: Oropharynx is clear and moist. No oropharyngeal exudate.  Eyes: Conjunctivae and EOM are normal. Pupils are equal, round, and reactive to light. Right eye exhibits no discharge. Left eye exhibits no discharge. No scleral icterus.  Neck: Normal range of motion. Neck supple. No thyromegaly present.  Cardiovascular: Normal rate, regular rhythm and normal heart sounds.   Pulmonary/Chest: Effort normal and breath sounds normal. No respiratory distress. He has no wheezes. He has no rales. He exhibits no tenderness.  Abdominal: Soft. Bowel sounds are normal. He exhibits no distension and no mass. There is no tenderness. There is no rebound and no guarding.  Musculoskeletal: Normal range of motion. He exhibits no edema  or tenderness.  Lymphadenopathy:    He has no cervical adenopathy.  Neurological: He is alert and oriented to person, place, and time. He displays normal reflexes. No cranial nerve deficit. He exhibits normal muscle tone. Coordination normal.  Skin: Skin is warm and dry. No rash noted. He is not diaphoretic.  Psychiatric: He has a normal mood and affect. His behavior is normal. Judgment and thought content normal.   Assessment & Plan:    Favor was seen today for annual exam.  Diagnoses and all orders for this visit:  Depression with anxiety  Other orders -     ALPRAZolam (XANAX) 1 MG tablet; Take 1 tablet (1 mg total) by mouth 2 (two) times daily as needed. for anxiety -     traZODone (DESYREL) 50 MG tablet; TAKE ONE-HALF (1/2) TO ONE TABLET (25 TO 50 MG TOTAL) AT BEDTIME AS NEEDED FOR SLEEP -     busPIRone (BUSPAR) 7.5 MG tablet; Take 1 tablet (7.5 mg total) by mouth 2 (two) times daily. -     FLUoxetine (PROZAC) 40 MG capsule; Take 1 capsule (40 mg total) by mouth daily.  I have discontinued Mr. Steagall FLUoxetine. I have also changed his ALPRAZolam and busPIRone. Additionally, I am having him start on FLUoxetine. Lastly, I am having him maintain his atorvastatin, valACYclovir, and traZODone.  Meds ordered this encounter  Medications  . ALPRAZolam (XANAX) 1 MG tablet    Sig: Take 1 tablet (1 mg total) by mouth 2 (two) times daily as needed. for anxiety    Dispense:  60 tablet    Refill:  1    Order Specific Question:  Supervising Provider    Answer:  Duncan Dull L [[id number]]  . traZODone (DESYREL) 50 MG tablet    Sig: TAKE ONE-HALF (1/2) TO ONE TABLET (25 TO 50 MG TOTAL) AT BEDTIME AS NEEDED FOR SLEEP    Dispense:  30 tablet    Refill:  0    Order Specific Question:  Supervising Provider    Answer:  Duncan Dull L [[id number]]  . busPIRone (BUSPAR) 7.5 MG tablet    Sig: Take 1 tablet (7.5 mg total) by mouth 2 (two) times daily.    Dispense:  180 tablet    Refill:  3    Order Specific Question:  Supervising Provider    Answer:  Duncan Dull L [[id number]]  . FLUoxetine (PROZAC) 40 MG capsule    Sig: Take 1 capsule (40 mg total) by mouth daily.    Dispense:  90 capsule    Refill:  3    Order Specific Question:  Supervising Provider    Answer:  Sherlene Shams [[id number]]     Follow-up: Return in about 1 week (around 01/14/2016) for Physical exam w/ labs 11:30 am spot .

## 2016-01-13 NOTE — Assessment & Plan Note (Signed)
Established- worsening  Denies SI/HI  Xanax, prozac and trazodone sent to pharmacy Upped prozac to 40 mg daily  FU in 1 week

## 2016-01-14 ENCOUNTER — Ambulatory Visit (INDEPENDENT_AMBULATORY_CARE_PROVIDER_SITE_OTHER): Payer: Managed Care, Other (non HMO) | Admitting: Nurse Practitioner

## 2016-01-14 ENCOUNTER — Encounter: Payer: Self-pay | Admitting: Nurse Practitioner

## 2016-01-14 VITALS — BP 110/64 | HR 76 | Temp 98.5°F | Resp 14 | Ht 68.0 in | Wt 221.2 lb

## 2016-01-14 DIAGNOSIS — R7989 Other specified abnormal findings of blood chemistry: Secondary | ICD-10-CM

## 2016-01-14 DIAGNOSIS — Z0000 Encounter for general adult medical examination without abnormal findings: Secondary | ICD-10-CM | POA: Diagnosis not present

## 2016-01-14 DIAGNOSIS — Z0001 Encounter for general adult medical examination with abnormal findings: Secondary | ICD-10-CM

## 2016-01-14 LAB — LIPID PANEL
CHOLESTEROL: 247 mg/dL — AB (ref 0–200)
HDL: 50.5 mg/dL (ref >39.00)
NonHDL: 196.43
TRIGLYCERIDES: 222 mg/dL — AB (ref 0.0–149.0)
Total CHOL/HDL Ratio: 5
VLDL: 44.4 mg/dL — ABNORMAL HIGH (ref 0.0–40.0)

## 2016-01-14 LAB — COMPREHENSIVE METABOLIC PANEL
ALBUMIN: 4.1 g/dL (ref 3.5–5.2)
ALT: 55 U/L — ABNORMAL HIGH (ref 0–53)
AST: 29 U/L (ref 0–37)
Alkaline Phosphatase: 75 U/L (ref 39–117)
BILIRUBIN TOTAL: 0.5 mg/dL (ref 0.2–1.2)
BUN: 12 mg/dL (ref 6–23)
CALCIUM: 9.1 mg/dL (ref 8.4–10.5)
CO2: 28 meq/L (ref 19–32)
CREATININE: 0.88 mg/dL (ref 0.40–1.50)
Chloride: 101 mEq/L (ref 96–112)
GFR: 100.31 mL/min (ref >60.00)
GLUCOSE: 96 mg/dL (ref 70–99)
Potassium: 4.5 mEq/L (ref 3.5–5.1)
Sodium: 137 mEq/L (ref 135–145)
TOTAL PROTEIN: 6.5 g/dL (ref 6.0–8.3)

## 2016-01-14 LAB — CBC WITH DIFFERENTIAL/PLATELET
BASOS ABS: 0 10*3/uL (ref 0.0–0.1)
Basophils Relative: 0.2 % (ref 0.0–3.0)
EOS ABS: 0.2 10*3/uL (ref 0.0–0.7)
Eosinophils Relative: 4.1 % (ref 0.0–5.0)
HCT: 42.6 % (ref 39.0–52.0)
Hemoglobin: 14.5 g/dL (ref 13.0–17.0)
LYMPHS ABS: 0.7 10*3/uL (ref 0.7–4.0)
LYMPHS PCT: 17.3 % (ref 12.0–46.0)
MCHC: 34 g/dL (ref 30.0–36.0)
MCV: 87.9 fl (ref 78.0–100.0)
Monocytes Absolute: 0.6 10*3/uL (ref 0.1–1.0)
Monocytes Relative: 15.7 % — ABNORMAL HIGH (ref 3.0–12.0)
NEUTROS ABS: 2.6 10*3/uL (ref 1.4–7.7)
NEUTROS PCT: 62.7 % (ref 43.0–77.0)
PLATELETS: 197 10*3/uL (ref 150.0–400.0)
RBC: 4.85 Mil/uL (ref 4.22–5.81)
RDW: 13.5 % (ref 11.5–15.5)
WBC: 4.1 10*3/uL (ref 4.0–10.5)

## 2016-01-14 LAB — TSH: TSH: 1.61 u[IU]/mL (ref 0.35–4.50)

## 2016-01-14 LAB — LDL CHOLESTEROL, DIRECT: Direct LDL: 158 mg/dL

## 2016-01-14 LAB — HEMOGLOBIN A1C: Hgb A1c MFr Bld: 5.4 % (ref 4.6–6.5)

## 2016-01-14 MED ORDER — FLUOXETINE HCL 40 MG PO CAPS: 40.0000 mg | ORAL_CAPSULE | Freq: Every day | ORAL | Status: DC

## 2016-01-14 NOTE — Assessment & Plan Note (Addendum)
Discussed acute and chronic issues. Reviewed health maintenance measures, PFSHx, and immunizations. Obtain routine labs TSH, Lipid panel, CBC w/ diff, A1c, and CMET.  Declines Tdap and HIV screening today

## 2016-01-14 NOTE — Progress Notes (Signed)
Patient ID: Todd Miranda, male    DOB: 1971-05-22  Age: 45 y.o. MRN: [id number]  CC: Annual Exam   HPI [name] presents for Annual Exam.   1) Annual Physical   Diet- No formal   Exercise- Running daily if weather is good   Immunizations- tdap declines  Eye Exam- Not UTD  Dental Exam- Not UTD  Labs- Fasting  Fall- 1 fall in the last year- young, tripped when inebriated   Depression- Neg.  Refills: Didn't receive prozac, [facility] to [facility]   History [name] has no past medical history on file.   He has past surgical history that includes Hernia repair and Cyst removal trunk (1993).   His family history includes Cancer in his mother.He reports that he has been smoking Cigarettes.  He started smoking about 12 months ago. He has been smoking about 1.00 pack per day. He has never used smokeless tobacco. He reports that he drinks about 3.6 oz of alcohol per week. He reports that he does not use illicit drugs.  Outpatient Prescriptions Prior to Visit  Medication Sig Dispense Refill  . ALPRAZolam (XANAX) 1 MG tablet Take 1 tablet (1 mg total) by mouth 2 (two) times daily as needed. for anxiety 60 tablet 1  . atorvastatin (LIPITOR) 10 MG tablet Take 1 tablet (10 mg total) by mouth daily. 90 tablet 0  . busPIRone (BUSPAR) 7.5 MG tablet Take 1 tablet (7.5 mg total) by mouth 2 (two) times daily. 180 tablet 3  . FLUoxetine (PROZAC) 40 MG capsule Take 1 capsule (40 mg total) by mouth daily. 90 capsule 3  . traZODone (DESYREL) 50 MG tablet TAKE ONE-HALF (1/2) TO ONE TABLET (25 TO 50 MG TOTAL) AT BEDTIME AS NEEDED FOR SLEEP 30 tablet 0  . valACYclovir (VALTREX) 500 MG tablet Take 1 tablet (500 mg total) by mouth 2 (two) times daily. (Patient not taking: Reported on 01/14/2016) 6 tablet 2   No facility-administered medications prior to visit.    ROS Review of Systems  Constitutional: Negative for fever, chills, diaphoresis and fatigue.  Eyes: Negative for visual disturbance.   Respiratory: Negative for chest tightness, shortness of breath and wheezing.   Cardiovascular: Negative for chest pain, palpitations and leg swelling.  Gastrointestinal: Negative for nausea, vomiting and diarrhea.  Musculoskeletal: Negative for myalgias.  Skin: Negative for rash.  Neurological: Negative for dizziness, weakness, numbness and headaches.    Objective:  BP 110/64 mmHg  Pulse 76  Temp(Src) 98.5 F (36.9 C) (Oral)  Resp 14  Ht  (1.727 m)  Wt 221 lb 3.2 oz (100.336 kg)  BMI 33.64 kg/m2  SpO2 96%  Physical Exam  Constitutional: He is oriented to person, place, and time. He appears well-developed and well-nourished. No distress.  HENT:  Head: Normocephalic and atraumatic.  Right Ear: External ear normal.  Left Ear: External ear normal.  Nose: Nose normal.  Mouth/Throat: Oropharynx is clear and moist. No oropharyngeal exudate.  TMs clear bilaterally  Eyes: Conjunctivae and EOM are normal. Pupils are equal, round, and reactive to light. Right eye exhibits no discharge. Left eye exhibits no discharge. No scleral icterus.  Neck: Normal range of motion. Neck supple. No thyromegaly present.  Cardiovascular: Normal rate, regular rhythm, normal heart sounds and intact distal pulses.  Exam reveals no gallop and no friction rub.   No murmur heard. Pulmonary/Chest: Effort normal and breath sounds normal. No respiratory distress. He has no wheezes. He has no rales. He exhibits no tenderness.  Abdominal: Soft. Bowel sounds are normal. He exhibits no distension and no mass. There is no tenderness. There is no rebound and no guarding.  Genitourinary:  Deferred due to lack of concerns  Musculoskeletal: Normal range of motion. He exhibits no edema or tenderness.  Lymphadenopathy:    He has no cervical adenopathy.  Neurological: He is alert and oriented to person, place, and time. He displays normal reflexes. No cranial nerve deficit. He exhibits normal muscle tone. Coordination  normal.  Skin: Skin is warm and dry. No rash noted. He is not diaphoretic.  Psychiatric: He has a normal mood and affect. His behavior is normal. Judgment and thought content normal.   Assessment & Plan:   There are no diagnoses linked to this encounter. I am having Mr. Jayme CloudGonzalez maintain his atorvastatin, valACYclovir, ALPRAZolam, traZODone, busPIRone, and FLUoxetine.  Meds ordered this encounter  Medications  . DISCONTD: FLUoxetine (PROZAC) 20 MG capsule    Sig:      Follow-up: No Follow-up on file.

## 2016-01-14 NOTE — Patient Instructions (Signed)

## 2016-03-26 ENCOUNTER — Other Ambulatory Visit: Payer: Self-pay | Admitting: Nurse Practitioner

## 2016-03-26 NOTE — Telephone Encounter (Signed)
Pt called about needing a refill for ALPRAZolam (XANAX) 1 MG tablet.   [facility] [address] [facility] [place], [place] - [id number]   Call pt @ [phone]. Thank you!

## 2016-03-26 NOTE — Telephone Encounter (Signed)
Please advise refill, is following up with you. thanks

## 2016-03-27 MED ORDER — ALPRAZOLAM 1 MG PO TABS: 1.0000 mg | ORAL_TABLET | Freq: Two times a day (BID) | ORAL | Status: DC | PRN

## 2016-03-27 NOTE — Telephone Encounter (Signed)
Refill given. Please fax. Patient should keep his appointment.

## 2016-04-14 ENCOUNTER — Encounter: Payer: Self-pay | Admitting: Family Medicine

## 2016-04-14 ENCOUNTER — Ambulatory Visit (INDEPENDENT_AMBULATORY_CARE_PROVIDER_SITE_OTHER): Payer: Managed Care, Other (non HMO) | Admitting: Family Medicine

## 2016-04-14 VITALS — BP 108/68 | HR 61 | Temp 97.9°F | Wt 193.4 lb

## 2016-04-14 DIAGNOSIS — F329 Major depressive disorder, single episode, unspecified: Secondary | ICD-10-CM

## 2016-04-14 DIAGNOSIS — E669 Obesity, unspecified: Secondary | ICD-10-CM | POA: Diagnosis not present

## 2016-04-14 DIAGNOSIS — K429 Umbilical hernia without obstruction or gangrene: Secondary | ICD-10-CM | POA: Insufficient documentation

## 2016-04-14 DIAGNOSIS — E785 Hyperlipidemia, unspecified: Secondary | ICD-10-CM | POA: Diagnosis not present

## 2016-04-14 DIAGNOSIS — F418 Other specified anxiety disorders: Secondary | ICD-10-CM

## 2016-04-14 DIAGNOSIS — F32A Depression, unspecified: Secondary | ICD-10-CM

## 2016-04-14 DIAGNOSIS — F419 Anxiety disorder, unspecified: Secondary | ICD-10-CM

## 2016-04-14 MED ORDER — TRAZODONE HCL 50 MG PO TABS: ORAL_TABLET | ORAL | 0 refills | Status: DC

## 2016-04-14 MED ORDER — TRAZODONE HCL 50 MG PO TABS: ORAL_TABLET | ORAL | 1 refills | Status: DC

## 2016-04-14 NOTE — Assessment & Plan Note (Signed)
Weight has decreased about 30 pounds in 3 months. Suspect this is the result of some combination of him altering his diet and increasing exercise and has depression. Lab work 3 months ago was relatively unremarkable for cause of this. Patient reports he has been trying to lose weight and feels better about his weight now. We will monitor at follow-up and if continues to lose large amounts of weight would consider further workup.

## 2016-04-14 NOTE — Assessment & Plan Note (Signed)
No signs of obstruction or strangulation. We will refer to general surgery for evaluation.

## 2016-04-14 NOTE — Assessment & Plan Note (Signed)
Discussed rechecking LDL today after working on diet and exercise are deferred to his next visit. He will continue diet and exercise. We'll check an LDL at his follow-up in a month.

## 2016-04-14 NOTE — Progress Notes (Signed)
Todd Alar, MD Phone: [phone]  Todd Miranda is a 45 y.o. male who presents today for follow-up.  Depression/anxiety: Patient notes these things have slightly worsened since his last visit. Any little thing will set off his anxiety. He feels depressed. Notes he has a lot going on with work and with his family. He feels lonely, though he does report he has friends. He has also started walking and jogging to see if that helps. On Prozac and notes the increased dose does help some. Also on BuSpar, trazodone, and Xanax. Did see a therapist previously though felt it was not productive. Had one episode of SI about a week ago where he felt as though he would be better off not being alive though had no intent or plan to harm himself. He spoke with friends regarding this and feels better at this time. No SI at this time. Does note he drinks alcohol on occasion and this is excessive when he does drink. Does not take the Xanax at the same time he drinks alcohol. Does note some loose stools with his anxiety and diet changes.  Obesity: Patient notes he has been trying to lose some weight by walking and jogging. He notes his work is physically active for the [employer] company. He is also changed his diet. Has been eating healthier with lots of fruits and vegetables. Drinking just water. Does note he does not have much of an appetite. No early satiety or sweats.  Umbilical hernia: Notes sometimes this is started to hurt. Has a sharp discomfort at the site of the hernia. Only occurs once in a while. Lasts briefly. Always reduces. Has daily bowel movements.  HYPERLIPIDEMIA Symptoms Chest pain on exertion:  No   Leg claudication:   No Not currently taking Lipitor. Has been working on diet and exercise as outlined above.  PMH: Smoker   ROS see history of present illness  Objective  Physical Exam Vitals:   04/14/16 0956  BP: 108/68  Pulse: 61  Temp: 97.9 F (36.6 C)    BP Readings from Last 3  Encounters:  04/14/16 108/68  01/14/16 110/64  01/07/16 121/72   Wt Readings from Last 3 Encounters:  04/14/16 193 lb 6 oz (87.7 kg)  01/14/16 221 lb 3.2 oz (100.3 kg)  01/07/16 219 lb (99.3 kg)    Physical Exam  Constitutional: No distress.  HENT:  Head: Normocephalic and atraumatic.  Cardiovascular: Normal rate, regular rhythm and normal heart sounds.   Pulmonary/Chest: Effort normal and breath sounds normal.  Abdominal: Soft. Bowel sounds are normal. He exhibits no distension. There is no tenderness. There is no rebound and no guarding.  Umbilical hernia noted on the right aspect of the umbilicus, this fully reduces when the patient is laying down, nontender  Neurological: He is alert. Gait normal.  Skin: Skin is warm and dry. He is not diaphoretic.  Psychiatric:  Mood depressed, affect flat     Assessment/Plan: Please see individual problem list.  Depression with anxiety Continues worsening. One episode of SI about a week ago. No current SI. No intent or plan to harm himself at that time. We will refill his trazodone. He will continue his current medication regimen. Given the worsening and his extensive medication list for this issue we will refer to psychiatry for further help in managing this issue. We will also have him see a psychologist. He is given return precautions. If develops any intent or plan to harm himself he will go to the  emergency room.  Obesity, BMI unknown Weight has decreased about 30 pounds in 3 months. Suspect this is the result of some combination of him altering his diet and increasing exercise and has depression. Lab work 3 months ago was relatively unremarkable for cause of this. Patient reports he has been trying to lose weight and feels better about his weight now. We will monitor at follow-up and if continues to lose large amounts of weight would consider further workup.  Umbilical hernia No signs of obstruction or strangulation. We will refer to  general surgery for evaluation.  Hyperlipidemia Discussed rechecking LDL today after working on diet and exercise are deferred to his next visit. He will continue diet and exercise. We'll check an LDL at his follow-up in a month.   Orders Placed This Encounter  Procedures  . Ambulatory referral to General Surgery    Referral Priority:   Routine    Referral Type:   Surgical    Referral Reason:   Specialty Services Required    Requested Specialty:   General Surgery    Number of Visits Requested:   1  . Ambulatory referral to Psychiatry    Referral Priority:   Routine    Referral Type:   Psychiatric    Referral Reason:   Specialty Services Required    Requested Specialty:   Psychiatry    Number of Visits Requested:   1    Meds ordered this encounter  Medications  . DISCONTD: traZODone (DESYREL) 50 MG tablet    Sig: TAKE ONE-HALF (1/2) TO ONE TABLET (25 TO 50 MG TOTAL) AT BEDTIME AS NEEDED FOR SLEEP    Dispense:  90 tablet    Refill:  1  . traZODone (DESYREL) 50 MG tablet    Sig: TAKE ONE-HALF (1/2) TO ONE TABLET (25 TO 50 MG TOTAL) AT BEDTIME AS NEEDED FOR SLEEP    Dispense:  30 tablet    Refill:  0    Todd Alar, MD [facility]

## 2016-04-14 NOTE — Patient Instructions (Signed)
Nice to see you. We will refer you to psychiatry and psychology for your depression and anxiety. Please continue to work on diet and exercise for your cholesterol. We will refer you to a general surgeon for your umbilical hernia. Please continue to watch your weight and diet and exercise. If you develop chest pain, worsening depression or anxiety, thoughts of harming yourself, intent or plan to harm herself, abdominal pain, inability to reduce her hernia, lack of bowel movements, or any new or changing symptoms please seek medical attention.

## 2016-04-14 NOTE — Assessment & Plan Note (Signed)
Continues worsening. One episode of SI about a week ago. No current SI. No intent or plan to harm himself at that time. We will refill his trazodone. He will continue his current medication regimen. Given the worsening and his extensive medication list for this issue we will refer to psychiatry for further help in managing this issue. We will also have him see a psychologist. He is given return precautions. If develops any intent or plan to harm himself he will go to the emergency room.

## 2016-04-18 ENCOUNTER — Encounter: Payer: Self-pay | Admitting: Family Medicine

## 2016-04-21 ENCOUNTER — Encounter: Payer: Self-pay | Admitting: *Deleted

## 2016-04-28 ENCOUNTER — Ambulatory Visit (INDEPENDENT_AMBULATORY_CARE_PROVIDER_SITE_OTHER): Payer: Managed Care, Other (non HMO) | Admitting: General Surgery

## 2016-04-28 ENCOUNTER — Encounter: Payer: Self-pay | Admitting: General Surgery

## 2016-04-28 VITALS — Ht 69.0 in | Wt 189.0 lb

## 2016-04-28 DIAGNOSIS — K429 Umbilical hernia without obstruction or gangrene: Secondary | ICD-10-CM | POA: Diagnosis not present

## 2016-04-28 NOTE — Progress Notes (Signed)
Patient ID: Todd Miranda [place], male   DOB: January 16, 1971, 45 y.o.   MRN: [id number]  Chief Complaint  Patient presents with  . Other    umbilical hernia    HPI Todd Miranda [place] is a 45 y.o. male here today for a evaluation of a umbilical hernia. Patient states he noticed this about four years ago. He states in the last three months the area has got bigger. He has recently lost 30 pounds due to stressors and believes that this is causing the hernia to look larger. There is some pain with activity. Patient has a history of bilateral inguinal hernias that were repaired when he was a child. I have reviewed the history of present illness with the patient.  HPI  History reviewed. No pertinent past medical history.  Past Surgical History:  Procedure Laterality Date  . CYST REMOVAL TRUNK  1993   tailbone  . HERNIA REPAIR Bilateral    age 89 and 7 inguinal henri    Family History  Problem Relation Age of Onset  . Cancer Mother     breast cancer    Social History Social History  Substance Use Topics  . Smoking status: Current Every Day Smoker    Packs/day: 1.00    Types: Cigarettes    Start date: 12/22/2014  . Smokeless tobacco: Never Used  . Alcohol use 3.6 oz/week    6 Cans of beer per week     Comment: Whiskey on the rocks 1 glass a night     No Known Allergies  Current Outpatient Prescriptions  Medication Sig Dispense Refill  . ALPRAZolam (XANAX) 1 MG tablet Take 1 tablet (1 mg total) by mouth 2 (two) times daily as needed. for anxiety 60 tablet 0  . atorvastatin (LIPITOR) 10 MG tablet Take 1 tablet (10 mg total) by mouth daily. 90 tablet 0  . busPIRone (BUSPAR) 7.5 MG tablet Take 1 tablet (7.5 mg total) by mouth 2 (two) times daily. 180 tablet 3  . FLUoxetine (PROZAC) 40 MG capsule Take 1 capsule (40 mg total) by mouth daily. 90 capsule 3  . traZODone (DESYREL) 50 MG tablet TAKE ONE-HALF (1/2) TO ONE TABLET (25 TO 50 MG TOTAL) AT BEDTIME AS NEEDED FOR SLEEP 30 tablet 0   No  current facility-administered medications for this visit.     Review of Systems Review of Systems  Constitutional: Negative.   Respiratory: Negative.   Cardiovascular: Negative.     Height 5\' 9"  (1.753 m), weight 189 lb (85.7 kg).  Physical Exam Physical Exam  Constitutional: He is oriented to person, place, and time. He appears well-developed and well-nourished.  Eyes: Conjunctivae are normal. No scleral icterus.  Neck: Neck supple.  Cardiovascular: Normal rate, regular rhythm and normal heart sounds.   Pulmonary/Chest: Effort normal and breath sounds normal.  Abdominal: Soft. Normal appearance and bowel sounds are normal. There is no hepatomegaly. There is no tenderness. A hernia (umbilical) is present. Hernia confirmed positive in the right inguinal area (small). Hernia confirmed negative in the left inguinal area. Ventral: umbilical hernia.  Lymphadenopathy:    He has no cervical adenopathy.  Neurological: He is alert and oriented to person, place, and time.  Skin: Skin is warm and dry.    Data Reviewed Notes reviewed  Assessment    Umbilical hernia, small right inguinal hernia    Plan    Open umbilical hernia repair. Right inguinal hernia is very small and asymptomatic - repair not indicated at this time. It  will be followed.  Hernia precautions and incarceration were discussed with the patient. If they develop symptoms of an incarcerated hernia, they were encouraged to seek prompt medical attention.  I have recommended repair of the hernia using mesh on an outpatient basis in the near future. The risk of infection was reviewed. The role of prosthetic mesh to minimize the risk of recurrence was reviewed.    This information has been scribed by Ples Specter CMA.    Valton Schwartz G 04/28/2016, 4:56 PM

## 2016-04-28 NOTE — Patient Instructions (Signed)
Hernia, Adult A hernia is the bulging of an organ or tissue through a weak spot in the muscles of the abdomen (abdominal wall). Hernias develop most often near the navel or groin. There are many kinds of hernias. Common kinds include:  Femoral hernia. This kind of hernia develops under the groin in the upper thigh area.  Inguinal hernia. This kind of hernia develops in the groin or scrotum.  Umbilical hernia. This kind of hernia develops near the navel.  Hiatal hernia. This kind of hernia causes part of the stomach to be pushed up into the chest.  Incisional hernia. This kind of hernia bulges through a scar from an abdominal surgery. CAUSES This condition may be caused by:  Heavy lifting.  Coughing over a long period of time.  Straining to have a bowel movement.  An incision made during an abdominal surgery.  A birth defect (congenital defect).  Excess weight or obesity.  Smoking.  Poor nutrition.  Cystic fibrosis.  Excess fluid in the abdomen.  Undescended testicles. SYMPTOMS Symptoms of a hernia include:  A lump on the abdomen. This is the first sign of a hernia. The lump may become more obvious with standing, straining, or coughing. It may get bigger over time if it is not treated or if the condition causing it is not treated.  Pain. A hernia is usually painless, but it may become painful over time if treatment is delayed. The pain is usually dull and may get worse with standing or lifting heavy objects. Sometimes a hernia gets tightly squeezed in the weak spot (strangulated) or stuck there (incarcerated) and causes additional symptoms. These symptoms may include:  Vomiting.  Nausea.  Constipation.  Irritability. DIAGNOSIS A hernia may be diagnosed with:  A physical exam. During the exam your health care provider may ask you to cough or to make a specific movement, because a hernia is usually more visible when you move.  Imaging tests. These can  include:  X-rays.  Ultrasound.  CT scan. TREATMENT A hernia that is small and painless may not need to be treated. A hernia that is large or painful may be treated with surgery. Inguinal hernias may be treated with surgery to prevent incarceration or strangulation. Strangulated hernias are always treated with surgery, because lack of blood to the trapped organ or tissue can cause it to die. Surgery to treat a hernia involves pushing the bulge back into place and repairing the weak part of the abdomen. HOME CARE INSTRUCTIONS  Avoid straining.  Do not lift anything heavier than 10 lb (4.5 kg).  Lift with your leg muscles, not your back muscles. This helps avoid strain.  When coughing, try to cough gently.  Prevent constipation. Constipation leads to straining with bowel movements, which can make a hernia worse or cause a hernia repair to break down. You can prevent constipation by:  Eating a high-fiber diet that includes plenty of fruits and vegetables.  Drinking enough fluids to keep your urine clear or pale yellow. Aim to drink 6-8 glasses of water per day.  Using a stool softener as directed by your health care provider.  Lose weight, if you are overweight.  Do not use any tobacco products, including cigarettes, chewing tobacco, or electronic cigarettes. If you need help quitting, ask your health care provider.  Keep all follow-up visits as directed by your health care provider. This is important. Your health care provider may need to monitor your condition. SEEK MEDICAL CARE IF:  You have   swelling, redness, and pain in the affected area.  Your bowel habits change. SEEK IMMEDIATE MEDICAL CARE IF:  You have a fever.  You have abdominal pain that is getting worse.  You feel nauseous or you vomit.  You cannot push the hernia back in place by gently pressing on it while you are lying down.  The hernia:  Changes in shape or size.  Is stuck outside the  abdomen.  Becomes discolored.  Feels hard or tender.   This information is not intended to replace advice given to you by your health care provider. Make sure you discuss any questions you have with your health care provider.   Document Released: 09/08/2005 Document Revised: 09/29/2014 Document Reviewed: 07/19/2014 Elsevier Interactive Patient Education 2016 Elsevier Inc[employer]  

## 2016-05-06 ENCOUNTER — Telehealth: Payer: Self-pay | Admitting: General Surgery

## 2016-05-06 NOTE — Telephone Encounter (Signed)
I CALLED PT & L/M @ 12:48PM TO RET CALL. I NEED TO OBTAIN THE INFORMATION FOR HIS DPR.(FORM ON MY DESK)/MTH

## 2016-06-02 ENCOUNTER — Ambulatory Visit: Payer: Managed Care, Other (non HMO) | Admitting: Family Medicine

## 2016-06-02 DIAGNOSIS — Z0289 Encounter for other administrative examinations: Secondary | ICD-10-CM

## 2016-09-01 ENCOUNTER — Ambulatory Visit (INDEPENDENT_AMBULATORY_CARE_PROVIDER_SITE_OTHER): Payer: Managed Care, Other (non HMO) | Admitting: Family Medicine

## 2016-09-01 ENCOUNTER — Encounter: Payer: Self-pay | Admitting: Family Medicine

## 2016-09-01 VITALS — BP 98/70 | HR 70 | Temp 97.9°F | Wt 205.0 lb

## 2016-09-01 DIAGNOSIS — K429 Umbilical hernia without obstruction or gangrene: Secondary | ICD-10-CM | POA: Diagnosis not present

## 2016-09-01 DIAGNOSIS — R197 Diarrhea, unspecified: Secondary | ICD-10-CM

## 2016-09-01 DIAGNOSIS — F418 Other specified anxiety disorders: Secondary | ICD-10-CM

## 2016-09-01 DIAGNOSIS — E6609 Other obesity due to excess calories: Secondary | ICD-10-CM | POA: Diagnosis not present

## 2016-09-01 LAB — COMPREHENSIVE METABOLIC PANEL
ALK PHOS: 64 U/L (ref 39–117)
ALT: 58 U/L — AB (ref 0–53)
AST: 28 U/L (ref 0–37)
Albumin: 4.2 g/dL (ref 3.5–5.2)
BILIRUBIN TOTAL: 0.7 mg/dL (ref 0.2–1.2)
BUN: 12 mg/dL (ref 6–23)
CO2: 27 mEq/L (ref 19–32)
Calcium: 9.4 mg/dL (ref 8.4–10.5)
Chloride: 102 mEq/L (ref 96–112)
Creatinine, Ser: 0.88 mg/dL (ref 0.40–1.50)
GFR: 100.01 mL/min (ref >60.00)
GLUCOSE: 99 mg/dL (ref 70–99)
Potassium: 4.1 mEq/L (ref 3.5–5.1)
Sodium: 139 mEq/L (ref 135–145)
TOTAL PROTEIN: 6.5 g/dL (ref 6.0–8.3)

## 2016-09-01 LAB — LDL CHOLESTEROL, DIRECT: Direct LDL: 182 mg/dL

## 2016-09-01 NOTE — Progress Notes (Signed)
Pre visit review using our clinic review tool, if applicable. No additional management support is needed unless otherwise documented below in the visit note. 

## 2016-09-01 NOTE — Patient Instructions (Addendum)
Nice to see you. Please discuss whether or not you can see [facility] for your anxiety and depression. If your company will allow this we can place a referral. We will get you back in to see the surgeon for your umbilical hernia. Please continue to work on diet and exercise. If you develop intent or plan to harm yourself, abdominal pain, your hernia becomes stuck or painful, or any new or changing symptoms please seek medical attention immediately.

## 2016-09-02 DIAGNOSIS — R197 Diarrhea, unspecified: Secondary | ICD-10-CM | POA: Insufficient documentation

## 2016-09-02 NOTE — Progress Notes (Signed)
[name], MD Phone: [phone]  Todd Miranda is a 45 y.o. male who presents today for follow-up.  Depression/anxiety: Patient notes this is not very good. Notes he feels quite depressed and anxious. He feels as though he just does not want to be here though has no plans or intent to harm himself. Notes no HI. He just feels lonely. Is currently taking trazodone, Prozac, and BuSpar. He's been out of Xanax for last 2 months. Drinks alcohol about a fifth a week. Hasn't seen psychiatry or psychology yet. Patient has been on short-term disability through his work given these issues.  Depression screen [facility] 2/9 09/02/2016 01/14/2016  Decreased Interest 2 0  Down, Depressed, Hopeless 2 0  PHQ - 2 Score 4 0  Altered sleeping 3 -  Tired, decreased energy 0 -  Change in appetite 2 -  Feeling bad or failure about yourself  3 -  Trouble concentrating 1 -  Moving slowly or fidgety/restless 2 -  Suicidal thoughts 3 -  PHQ-9 Score 18 -  Difficult doing work/chores Not difficult at all -   GAD 7 : Generalized Anxiety Score 09/02/2016  Nervous, Anxious, on Edge 2  Control/stop worrying 3  Worry too much - different things 3  Trouble relaxing 3  Restless 2  Easily annoyed or irritable 0  Afraid - awful might happen 1  Total GAD 7 Score 14    Umbilical hernia: Patient notes no pain. Does not get stuck. He saw Gen. surgery and they wanted to do surgery. He never followed up with this. He has regular bowel movements.  Does have liquid stools. Was on antibiotics previously for dental procedure. No blood in the stool. No abdominal pain. Notes his liquid stools the patient on what he eats.   Obesity: Notes he pretty much eats whatever he wants. Does eat fairly healthfully though he eats a lot of food. Not much exercise.  PMH: Smoker   ROS see history of present illness  Objective  Physical Exam Vitals:   09/01/16 1046  BP: 98/70  Pulse: 70  Temp: 97.9 F (36.6 C)    BP  Readings from Last 3 Encounters:  09/01/16 98/70  04/14/16 108/68  01/14/16 110/64   Wt Readings from Last 3 Encounters:  09/01/16 205 lb (93 kg)  04/28/16 189 lb (85.7 kg)  04/14/16 193 lb 6 oz (87.7 kg)    Physical Exam  Constitutional: No distress.  Cardiovascular: Normal rate, regular rhythm and normal heart sounds.   Pulmonary/Chest: Effort normal and breath sounds normal.  Abdominal: Soft. Bowel sounds are normal. He exhibits no distension. There is no tenderness. There is no rebound and no guarding.  Umbilical hernia noted which is fully reducible and nontender  Neurological: He is alert. Gait normal.  Skin: He is not diaphoretic.  Psychiatric:  Mood depressed, affect depressed     Assessment/Plan: Please see individual problem list.  Depression with anxiety Worsened recently. Patient does note thoughts that he would be better off not being here though has no intent or plan to harm himself. Contracted for safety today. He is working through his company to see if they will cover a therapist and a psychiatrist. I encouraged him to check with them to see if he could see our therapist in the office. He will let us know if he is able to do this. A referral will be placed once he knows the answer to this question. He is given return precautions.  Umbilical hernia Asymptomatic  currently. No signs of incarceration or strangulation. We will get him back in to see general surgery to reconsider surgery for this. Patient is given hernia return precautions.  Obesity, BMI unknown Encouraged diet and exercise.  Diarrhea Suspect intermittent loose stools are likely related to anxiety and IBS. Benign abdominal exam today. No other symptoms with this. Given his prior antibiotic exposure we will check stool studies as outlined below. He is given return precautions.   Orders Placed This Encounter  Procedures  . Stool C-Diff Toxin Assay    Standing Status:   Future    Standing Expiration  Date:   09/01/2017  . Comp Met (CMET)  . Direct LDL  . Gastrointestinal Pathogen Panel PCR    Standing Status:   Future    Standing Expiration Date:   09/01/2017    [name], MD [place]

## 2016-09-02 NOTE — Assessment & Plan Note (Signed)
Encouraged diet and exercise.  

## 2016-09-02 NOTE — Assessment & Plan Note (Signed)
Worsened recently. Patient does note thoughts that he would be better off not being here though has no intent or plan to harm himself. Contracted for safety today. He is working through his company to see if they will cover a therapist and a psychiatrist. I encouraged him to check with them to see if he could see our therapist in the office. He will let us know if he is able to do this. A referral will be placed once he knows the answer to this question. He is given return precautions.

## 2016-09-02 NOTE — Assessment & Plan Note (Signed)
Suspect intermittent loose stools are likely related to anxiety and IBS. Benign abdominal exam today. No other symptoms with this. Given his prior antibiotic exposure we will check stool studies as outlined below. He is given return precautions.

## 2016-09-02 NOTE — Assessment & Plan Note (Signed)
Asymptomatic currently. No signs of incarceration or strangulation. We will get him back in to see general surgery to reconsider surgery for this. Patient is given hernia return precautions.

## 2016-09-03 ENCOUNTER — Other Ambulatory Visit (INDEPENDENT_AMBULATORY_CARE_PROVIDER_SITE_OTHER): Payer: Managed Care, Other (non HMO)

## 2016-09-03 ENCOUNTER — Other Ambulatory Visit: Payer: Self-pay | Admitting: Family Medicine

## 2016-09-03 DIAGNOSIS — R197 Diarrhea, unspecified: Secondary | ICD-10-CM | POA: Diagnosis not present

## 2016-09-04 ENCOUNTER — Telehealth: Payer: Self-pay | Admitting: Family Medicine

## 2016-09-04 ENCOUNTER — Other Ambulatory Visit: Payer: Self-pay | Admitting: Family Medicine

## 2016-09-04 ENCOUNTER — Telehealth: Payer: Self-pay | Admitting: Internal Medicine

## 2016-09-04 ENCOUNTER — Telehealth: Payer: Self-pay | Admitting: Radiology

## 2016-09-04 LAB — GASTROINTESTINAL PATHOGEN PANEL PCR
C. difficile Tox A/B, PCR: DETECTED — CR
Campylobacter, PCR: NOT DETECTED
Cryptosporidium, PCR: NOT DETECTED
E COLI (ETEC) LT/ST, PCR: NOT DETECTED
E COLI (STEC) STX1/STX2, PCR: NOT DETECTED
E COLI 0157, PCR: NOT DETECTED
Giardia lamblia, PCR: NOT DETECTED
NOROVIRUS, PCR: NOT DETECTED
Rotavirus A, PCR: NOT DETECTED
SALMONELLA, PCR: NOT DETECTED
SHIGELLA, PCR: NOT DETECTED

## 2016-09-04 LAB — C. DIFFICILE GDH AND TOXIN A/B
C. difficile GDH: DETECTED — AB
C. difficile Toxin A/B: NOT DETECTED

## 2016-09-04 LAB — CLOSTRIDIUM DIFFICILE BY PCR: Toxigenic C. Difficile by PCR: DETECTED — CR

## 2016-09-04 MED ORDER — VANCOMYCIN HCL 125 MG PO CAPS: 125.0000 mg | ORAL_CAPSULE | Freq: Four times a day (QID) | ORAL | 0 refills | Status: DC

## 2016-09-04 NOTE — Telephone Encounter (Signed)
Opened in error. Please see other phone note from today.

## 2016-09-04 NOTE — Telephone Encounter (Signed)
Rx sent in for PO Vanc.

## 2016-09-04 NOTE — Telephone Encounter (Signed)
I have contacted the health department and left a message. I have also contacted a personal contact in infectious disease,without giving patient information, for further advise . Per health department recommendation she informed me that unfortunately the certified letter is the only thing to do at this point.

## 2016-09-04 NOTE — Telephone Encounter (Signed)
I have tried to call patient. Patients number is disconnected as well as the patients sister who is his emergency contact. I tried calling patients other emergency contact who is his significant other. I have called the patients pharmacy to see if they have any other numbers listed for the patient which they do not. They informed me also that the patient did not sign up for alerts and therefore will not be contacted once the prescription is filled. I have been unable to get in touch with the patient and will send out a letter to call the office.

## 2016-09-04 NOTE — Telephone Encounter (Signed)
[facility] called for critical lab result. Verified for positive C-Diff. Dr. Adriana Simasook notified.

## 2016-09-04 NOTE — Telephone Encounter (Signed)
FYI.  Received a call from [facility] regarding critical lab - positive.  Reported positive c. diff.   I tried to get in touch with patient.  Unable to reach.  Tried emergency contact.  Unable to reach.  Reviewed chart and unsuccessful attempts at trying to reach pt.  (it appears you are aware of positive test c. diff).

## 2016-09-04 NOTE — Telephone Encounter (Addendum)
Thanks for your help and work on this. I additionally tried to call both of the patients numbers and found the same issue. Given that you have not been able to get in contact with the patient I would suggest contacting the health department to see if this is a reportable issue, that way they can track the patient down if it is something that is with in their scope. You might check with Harriett Sineancy to see if this is an option.

## 2016-09-05 NOTE — Telephone Encounter (Signed)
I am aware. They tried multiple times yesterday to contact the patient with no luck. It appears that they contacted the health department and they advised sending a certified letter. Certified letter has already been sent.

## 2016-09-08 MED ORDER — ATORVASTATIN CALCIUM 40 MG PO TABS: 40.0000 mg | ORAL_TABLET | Freq: Every day | ORAL | 2 refills | Status: DC

## 2016-09-08 NOTE — Telephone Encounter (Signed)
Patient called back. CMA informed of lab results and C. difficile results. Advised to pick vancomycin up per CMA. Lipitor sent to the pharmacy. CMA also informed patient to clean things with bleach solution around the house to limit reinfection.

## 2016-09-08 NOTE — Addendum Note (Signed)
Addended by: Birdie SonsSONNENBERG, Carlie Corpus G on: 09/08/2016 12:50 PM   Modules accepted: Orders

## 2016-09-08 NOTE — Telephone Encounter (Signed)
Patient notified of lab results and states he has not been taking Lipitor. Please send rx to [facility] pharmacy.patient informed to avoid direct contact with children and elderly and to wash hands to prevent spread of infection.

## 2016-09-11 ENCOUNTER — Telehealth: Payer: Self-pay | Admitting: Family Medicine

## 2016-09-11 NOTE — Telephone Encounter (Signed)
Pt dropped off FMLA paper work. These papers need to be completed and submitted. Papers are up front in color folder.

## 2016-09-11 NOTE — Telephone Encounter (Signed)
noted 

## 2016-09-24 ENCOUNTER — Telehealth: Payer: Self-pay | Admitting: *Deleted

## 2016-09-24 DIAGNOSIS — Z7689 Persons encountering health services in other specified circumstances: Secondary | ICD-10-CM

## 2016-09-24 NOTE — Telephone Encounter (Signed)
Is this ready?

## 2016-09-24 NOTE — Telephone Encounter (Signed)
Patient has requested a update on this paperwork  Please call pt at [phone]

## 2016-09-24 NOTE — Telephone Encounter (Signed)
Paper work has been completed. I am releasing the patient back to work per our discussion at his office visit. Please confirm this with the patient. Thanks.

## 2016-09-24 NOTE — Telephone Encounter (Signed)
Patient notified, from faxed per patient request and patient has picked up hard copy

## 2016-10-13 ENCOUNTER — Other Ambulatory Visit: Payer: Managed Care, Other (non HMO)

## 2016-10-29 ENCOUNTER — Ambulatory Visit: Payer: Managed Care, Other (non HMO) | Admitting: Family Medicine

## 2016-12-14 ENCOUNTER — Other Ambulatory Visit: Payer: Self-pay | Admitting: Nurse Practitioner

## 2017-01-01 ENCOUNTER — Other Ambulatory Visit: Payer: Self-pay | Admitting: Nurse Practitioner

## 2017-06-18 ENCOUNTER — Ambulatory Visit (INDEPENDENT_AMBULATORY_CARE_PROVIDER_SITE_OTHER): Payer: BLUE CROSS/BLUE SHIELD | Admitting: Family Medicine

## 2017-06-18 ENCOUNTER — Encounter: Payer: Self-pay | Admitting: Family Medicine

## 2017-06-18 ENCOUNTER — Telehealth: Payer: Self-pay | Admitting: Radiology

## 2017-06-18 VITALS — BP 128/70 | HR 81 | Temp 97.6°F | Resp 16 | Ht 67.5 in | Wt 201.6 lb

## 2017-06-18 DIAGNOSIS — R197 Diarrhea, unspecified: Secondary | ICD-10-CM | POA: Diagnosis not present

## 2017-06-18 DIAGNOSIS — E669 Obesity, unspecified: Secondary | ICD-10-CM | POA: Diagnosis not present

## 2017-06-18 DIAGNOSIS — M25551 Pain in right hip: Secondary | ICD-10-CM | POA: Diagnosis not present

## 2017-06-18 DIAGNOSIS — Z13220 Encounter for screening for lipoid disorders: Secondary | ICD-10-CM

## 2017-06-18 DIAGNOSIS — Z0001 Encounter for general adult medical examination with abnormal findings: Secondary | ICD-10-CM | POA: Diagnosis not present

## 2017-06-18 DIAGNOSIS — K429 Umbilical hernia without obstruction or gangrene: Secondary | ICD-10-CM | POA: Diagnosis not present

## 2017-06-18 DIAGNOSIS — F418 Other specified anxiety disorders: Secondary | ICD-10-CM | POA: Diagnosis not present

## 2017-06-18 DIAGNOSIS — E66811 Obesity, class 1: Secondary | ICD-10-CM

## 2017-06-18 NOTE — Telephone Encounter (Signed)
Pt got sample for C-Diff send off test. PT is taking home ifob 3 day test. Please place future order for ifob test. Thank you.

## 2017-06-18 NOTE — Assessment & Plan Note (Signed)
Suspect trochanteric bursitis. He can ice and sparingly taking anti-inflammatories. Given stretches. If not improving could refer for an injection.

## 2017-06-18 NOTE — Assessment & Plan Note (Signed)
Physical exam completed. Encouraged continued activity and diet changes. Tetanus vaccination given. Encouraged to monitor alcohol intake. Patient deferred flu shot.

## 2017-06-18 NOTE — Assessment & Plan Note (Addendum)
Continues to have issues with loose stools. Possibly IBS in nature. Benign abdominal exam. He does have a history of C. difficile and thus we will check this today. He will also complete FOBT test at home given dark stools. He'll continue to monitor.

## 2017-06-18 NOTE — Progress Notes (Signed)
[name], MD Phone: [phone]  Todd Miranda is a 46 y.o. male who presents today for physical exam.  Stays active by being on his feet for a 10,000 steps a day at work. Tries to eat fairly healthily. Cooks healthy foods. He's cut down on his soda. Not much fast food. Due for tetanus vaccination. Declines flu shot. Reports prior HIV testing. Family history of breast cancer though no prostate or colon cancer. He is drinking socially which is improved from prior when he is drinking daily and up to a fifth of liquor per week. Occasional marijuana use though no other illicit drug use. No longer smoking. Quit cold [place] 2 months ago.  Anxiety/depression: Notes it's there but it's significantly better. He weaned himself off of everything and feels as though the medication was making things worse. He is doing great coming off of it. He is more reasonable and calm. No SI or HI.  He reports intermittent diarrhea since being treated for C. difficile last year. He's also had some dark colored stools. No tarry stools. No bright red blood. No abdominal pain. His stools are greasy at times. Has some bowel urgency.  He notes his right hip hurts as well for the last couple of days. He notes some discomfort with walking. Notes it's a sharp pain laterally. No specific injury.  Active Ambulatory Problems    Diagnosis Date Noted  . Fatigue 01/31/2013  . Anxiety 12/19/2013  . Depression with anxiety 01/16/2014  . Sleep disturbance 02/27/2014  . Obesity, BMI unknown 10/02/2014  . HSV-1 infection 04/23/2015  . Encounter for general adult medical examination with abnormal findings 01/14/2016  . Umbilical hernia 45/80/9983  . Hyperlipidemia 04/14/2016  . Diarrhea 09/02/2016  . Right hip pain 06/18/2017   Resolved Ambulatory Problems    Diagnosis Date Noted  . General medical exam 01/31/2013  . Wrist pain, right 12/19/2013  . Pain, wrist 01/16/2014   No Additional Past Medical History     Family History  Problem Relation Age of Onset  . Cancer Mother        breast cancer    Social History   Social History  . Marital status: Single    Spouse name: N/A  . Number of children: N/A  . Years of education: N/A   Occupational History  . Not on file.   Social History Main Topics  . Smoking status: Current Every Day Smoker    Packs/day: 1.00    Types: Cigarettes    Start date: 12/22/2014  . Smokeless tobacco: Never Used  . Alcohol use 3.6 oz/week    6 Cans of beer per week     Comment: Whiskey on the rocks 1 glass a night   . Drug use: No  . Sexual activity: Yes    Partners: Female     Comment: 1 partner currently    Other Topics Concern  . Not on file   Social History Narrative   Works for time Com[employer].        ROS  General:  Negative for nexplained weight loss, fever Skin: Negative for new or changing mole, sore that won't heal HEENT: Negative for trouble hearing, trouble seeing, ringing in ears, mouth sores, hoarseness, change in voice, dysphagia. CV:  Negative for chest pain, dyspnea, edema, palpitations Resp: Negative for cough, dyspnea, hemoptysis GI: Positive for diarrhea, dark colored stools, Negative for nausea, vomiting, constipation, abdominal pain, melena, hematochezia. GU: Negative for dysuria, incontinence, urinary hesitance, hematuria, vaginal or penile  discharge, polyuria, sexual difficulty, lumps in testicle or breasts MSK: Negative for muscle cramps or aches, positive joint pain or swelling Neuro: Negative for headaches, weakness, numbness, dizziness, passing out/fainting Psych: Positive for depression, anxiety, negative for memory problems  Objective  Physical Exam Vitals:   06/18/17 0914  BP: 128/70  Pulse: 81  Resp: 16  Temp: 97.6 F (36.4 C)  SpO2: 98%    BP Readings from Last 3 Encounters:  06/18/17 128/70  09/01/16 98/70  04/14/16 108/68   Wt Readings from Last 3 Encounters:  06/18/17 201 lb 9.6 oz (91.4 kg)   09/01/16 205 lb (93 kg)  04/28/16 189 lb (85.7 kg)    Physical Exam  Constitutional: No distress.  HENT:  Head: Normocephalic and atraumatic.  Mouth/Throat: Oropharynx is clear and moist. No oropharyngeal exudate.  Eyes: Conjunctivae are normal.  Cardiovascular: Normal rate, regular rhythm and normal heart sounds.   Pulmonary/Chest: Effort normal and breath sounds normal.  Abdominal: Soft. Bowel sounds are normal. He exhibits no distension. There is no tenderness. There is no rebound and no guarding.  Genitourinary:  Genitourinary Comments: External rectum normal, digital rectal exam difficult to complete given patient clamping down on his rectum  Musculoskeletal: He exhibits no edema.  Tender over the right greater trochanter, discomfort on internal rotation of the right hip, no discomfort on palpation of the left hip or other ranges of motion in the right or left hip  Neurological: He is alert. Gait normal.  Skin: Skin is warm and dry. He is not diaphoretic.   Umbilical hernia that is fully reducible and nontender noted  Assessment/Plan:   Encounter for general adult medical examination with abnormal findings Physical exam completed. Encouraged continued activity and diet changes. Tetanus vaccination given. Encouraged to monitor alcohol intake. Patient deferred flu shot.  Umbilical hernia No recurrent issues with this. Patient will monitor. Given hernia return precautions.  Diarrhea Continues to have issues with loose stools. Possibly IBS in nature. Benign abdominal exam. He does have a history of C. difficile and thus we will check this today. He will also complete FOBT test at home given dark stools. He'll continue to monitor.  Depression with anxiety Quite a bit improved. No SI or HI. He does not want to see a therapist or be on medication. He'll contact us if this worsens. Given return precautions.  Right hip pain Suspect trochanteric bursitis. He can ice and sparingly  taking anti-inflammatories. Given stretches. If not improving could refer for an injection.   Orders Placed This Encounter  Procedures  . Fecal occult blood, imunochemical    Standing Status:   Future    Standing Expiration Date:   06/18/2018  . Lipid panel    Standing Status:   Future    Standing Expiration Date:   06/18/2018  . Comp Met (CMET)    Standing Status:   Future    Standing Expiration Date:   06/18/2018  . HgB A1c    Standing Status:   Future    Standing Expiration Date:   06/18/2018  . C. difficile GDH and Toxin A/B    Standing Status:   Future    Number of Occurrences:   1    Standing Expiration Date:   06/18/2018    No orders of the defined types were placed in this encounter.    [name], MD Jack

## 2017-06-18 NOTE — Patient Instructions (Addendum)
Nice to see you. Please continue to work on staying active and work on your[employer] Congratulations on quitting smoking. Please monitor your anxiety and depression and if it worsens please let us know. If you develop thoughts of harming yourself or others please seek medical attention immediately. We are going to check your stool for blood. We'll also check for infection. We'll contact her with the results.   Trochanteric Bursitis Rehab Ask your health care provider which exercises are safe for you. Do exercises exactly as told by your health care provider and adjust them as directed. It is normal to feel mild stretching, pulling, tightness, or discomfort as you do these exercises, but you should stop right away if you feel sudden pain or your pain gets worse.Do not begin these exercises until told by your health care provider. Stretching exercises These exercises warm up your muscles and joints and improve the movement and flexibility of your hip. These exercises also help to relieve pain and stiffness. Exercise A: Iliotibial band stretch  1. Lie on your side with your left / right leg in the top position. 2. Bend your left / right knee and grab your ankle. 3. Slowly bring your knee back so your thigh is behind your body. 4. Slowly lower your knee toward the floor until you feel a gentle stretch on the outside of your left / right thigh. If you do not feel a stretch and your knee will not fall farther, place the heel of your other foot on top of your outer knee and pull your thigh down farther. 5. Hold this position for __________ seconds. 6. Slowly return to the starting position. Repeat __________ times. Complete this exercise __________ times a day. Strengthening exercises These exercises build strength and endurance in your hip and pelvis. Endurance is the ability to use your muscles for a long time, even after they get tired. Exercise B: Bridge ( hip extensors) 1. Lie on your back on a  firm surface with your knees bent and your feet flat on the floor. 2. Tighten your buttocks muscles and lift your buttocks off the floor until your trunk is level with your thighs. You should feel the muscles working in your buttocks and the back of your thighs. If this exercise is too easy, try doing it with your arms crossed over your chest. 3. Hold this position for __________ seconds. 4. Slowly return to the starting position. 5. Let your muscles relax completely between repetitions. Repeat __________ times. Complete this exercise __________ times a day. Exercise C: Squats ( knee extensors and  quadriceps) 1. Stand in front of a table, with your feet and knees pointing straight ahead. You may rest your hands on the table for balance but not for support. 2. Slowly bend your knees and lower your hips like you are going to sit in a chair. ? Keep your weight over your heels, not over your toes. ? Keep your lower legs upright so they are parallel with the table legs. ? Do not let your hips go lower than your knees. ? Do not bend lower than told by your health care provider. ? If your hip pain increases, do not bend as low. 3. Hold this position for __________ seconds. 4. Slowly push with your legs to return to standing. Do not use your hands to pull yourself to standing. Repeat __________ times. Complete this exercise __________ times a day. Exercise D: Hip hike 1. Stand sideways on a bottom step. Stand on  your left / right leg with your other foot unsupported next to the step. You can hold onto the railing or wall if needed for balance. 2. Keeping your knees straight and your torso square, lift your left / right hip up toward the ceiling. 3. Hold this position for __________ seconds. 4. Slowly let your left / right hip lower toward the floor, past the starting position. Your foot should get closer to the floor. Do not lean or bend your knees. Repeat __________ times. Complete this exercise  __________ times a day. Exercise E: Single leg stand 1. Stand near a counter or door frame that you can hold onto for balance as needed. It is helpful to stand in front of a mirror for this exercise so you can watch your hip. 2. Squeeze your left / right buttock muscles then lift up your other foot. Do not let your left / right hip push out to the side. 3. Hold this position for __________ seconds. Repeat __________ times. Complete this exercise __________ times a day. This information is not intended to replace advice given to you by your health care provider. Make sure you discuss any questions you have with your health care provider. Document Released: 10/16/2004 Document Revised: 05/15/2016 Document Reviewed: 08/24/2015 Elsevier Interactive Patient Education  [employer].

## 2017-06-18 NOTE — Assessment & Plan Note (Signed)
Quite a bit improved. No SI or HI. He does not want to see a therapist or be on medication. He'll contact us if this worsens. Given return precautions.

## 2017-06-18 NOTE — Telephone Encounter (Addendum)
ifob order has been placed. Please disregard message.

## 2017-06-18 NOTE — Assessment & Plan Note (Signed)
No recurrent issues with this. Patient will monitor. Given hernia return precautions.

## 2017-06-19 ENCOUNTER — Other Ambulatory Visit: Payer: BLUE CROSS/BLUE SHIELD

## 2017-06-19 ENCOUNTER — Other Ambulatory Visit: Payer: Self-pay | Admitting: Family Medicine

## 2017-06-19 DIAGNOSIS — E669 Obesity, unspecified: Secondary | ICD-10-CM

## 2017-06-19 DIAGNOSIS — Z13220 Encounter for screening for lipoid disorders: Secondary | ICD-10-CM | POA: Diagnosis not present

## 2017-06-19 DIAGNOSIS — K529 Noninfective gastroenteritis and colitis, unspecified: Secondary | ICD-10-CM

## 2017-06-19 LAB — C. DIFFICILE GDH AND TOXIN A/B
GDH ANTIGEN: NOT DETECTED
MICRO NUMBER: 81073954
SPECIMEN QUALITY: ADEQUATE
TOXIN A AND B: NOT DETECTED

## 2017-06-19 NOTE — Addendum Note (Signed)
Addended by: WIGGINS, Pascual Mantel N on: 06/19/2017 03:17 PM   Modules accepted: Orders  

## 2017-06-19 NOTE — Addendum Note (Signed)
Addended by: WIGGINS, Guled Gahan N on: 06/19/2017 03:17 PM   Modules accepted: Orders  

## 2017-06-19 NOTE — Addendum Note (Signed)
Addended by: Penne Lash on: 06/19/2017 03:17 PM   Modules accepted: Orders

## 2017-06-20 LAB — COMPREHENSIVE METABOLIC PANEL
AG RATIO: 1.8 (calc) (ref 1.0–2.5)
ALBUMIN MSPROF: 4.2 g/dL (ref 3.6–5.1)
ALT: 34 U/L (ref 9–46)
AST: 24 U/L (ref 10–40)
Alkaline phosphatase (APISO): 53 U/L (ref 40–115)
BILIRUBIN TOTAL: 0.4 mg/dL (ref 0.2–1.2)
BUN: 12 mg/dL (ref 7–25)
CALCIUM: 9 mg/dL (ref 8.6–10.3)
CO2: 26 mmol/L (ref 20–32)
Chloride: 103 mmol/L (ref 98–110)
Creat: 1.01 mg/dL (ref 0.60–1.35)
GLUCOSE: 89 mg/dL (ref 65–99)
Globulin: 2.3 g/dL (calc) (ref 1.9–3.7)
Potassium: 5 mmol/L (ref 3.5–5.3)
Sodium: 140 mmol/L (ref 135–146)
TOTAL PROTEIN: 6.5 g/dL (ref 6.1–8.1)

## 2017-06-20 LAB — LIPID PANEL
CHOL/HDL RATIO: 2.8 (calc) (ref <5.0)
CHOLESTEROL: 241 mg/dL — AB (ref <200)
HDL: 86 mg/dL (ref >40)
LDL Cholesterol (Calc): 124 mg/dL (calc) — ABNORMAL HIGH
Non-HDL Cholesterol (Calc): 155 mg/dL (calc) — ABNORMAL HIGH (ref <130)
Triglycerides: 191 mg/dL — ABNORMAL HIGH (ref <150)

## 2017-06-20 LAB — HEMOGLOBIN A1C
Hgb A1c MFr Bld: 4.9 % of total Hgb (ref <5.7)
Mean Plasma Glucose: 94 (calc)
eAG (mmol/L): 5.2 (calc)

## 2017-06-22 ENCOUNTER — Telehealth: Payer: Self-pay | Admitting: *Deleted

## 2017-06-22 NOTE — Telephone Encounter (Signed)
Pt requested lab results Pt contact [phone]

## 2017-06-22 NOTE — Telephone Encounter (Signed)
See result note.  

## 2017-06-29 ENCOUNTER — Other Ambulatory Visit: Payer: BLUE CROSS/BLUE SHIELD

## 2017-07-01 ENCOUNTER — Other Ambulatory Visit (INDEPENDENT_AMBULATORY_CARE_PROVIDER_SITE_OTHER): Payer: BLUE CROSS/BLUE SHIELD

## 2017-07-01 DIAGNOSIS — R197 Diarrhea, unspecified: Secondary | ICD-10-CM

## 2017-07-01 LAB — FECAL OCCULT BLOOD, IMMUNOCHEMICAL: Fecal Occult Bld: NEGATIVE

## 2017-07-23 ENCOUNTER — Encounter (INDEPENDENT_AMBULATORY_CARE_PROVIDER_SITE_OTHER): Payer: Self-pay

## 2017-07-23 ENCOUNTER — Other Ambulatory Visit
Admission: RE | Admit: 2017-07-23 | Payer: BLUE CROSS/BLUE SHIELD | Source: Ambulatory Visit | Admitting: Gastroenterology

## 2017-07-23 ENCOUNTER — Ambulatory Visit (INDEPENDENT_AMBULATORY_CARE_PROVIDER_SITE_OTHER): Payer: BLUE CROSS/BLUE SHIELD | Admitting: Gastroenterology

## 2017-07-23 ENCOUNTER — Other Ambulatory Visit
Admission: RE | Admit: 2017-07-23 | Discharge: 2017-07-23 | Disposition: A | Discharge status: Home / Self Care | Payer: BLUE CROSS/BLUE SHIELD | Source: Ambulatory Visit | Attending: Gastroenterology | Admitting: Gastroenterology

## 2017-07-23 ENCOUNTER — Encounter: Payer: Self-pay | Admitting: Gastroenterology

## 2017-07-23 VITALS — BP 127/78 | HR 92 | Temp 98.6°F | Ht 67.5 in | Wt 196.0 lb

## 2017-07-23 DIAGNOSIS — R197 Diarrhea, unspecified: Secondary | ICD-10-CM | POA: Diagnosis not present

## 2017-07-23 LAB — TSH: TSH: 0.917 u[IU]/mL (ref 0.350–4.500)

## 2017-07-23 LAB — C-REACTIVE PROTEIN: CRP: <0.8 mg/dL (ref <1.0)

## 2017-07-23 NOTE — Progress Notes (Signed)
Wyline Mood MD, MRCP(U.K) [address]  [address]  [place], [place] [address]  Main: [phone]  Fax: [phone]   Gastroenterology Consultation  Referring Provider:     Glori Luis, MD Primary Care Physician:  Glori Luis, MD Primary Gastroenterologist:  Dr. Wyline Mood  Reason for Consultation:     Diarrhea         HPI:   Todd Miranda is a 46 y.o. y/o male referred for consultation & management  by Dr. Birdie Sons, Yehuda Mao, MD.    C diff test was negative 06/18/17   Diarrhea :  Onset: Began 1 year back - no change    Number of bowel movements a day : multiple (does not count), none in the night    Color : no blood   Consistency: runny depending on what he has eaten  Present status: ongoing    Shape of stool:  None    Weight loss:  None  Prior colonoscopy:  None   Artificial sugars/sodas/chewing gum:  None    Bloating:  None   Gas:  Occasional use- when he does pass gas at time will have a bowel movement  Antibiotic use: none  Treated for C diff 1 year back - that's when he started diarrhea. Began after a course of antibiotics after a tooth extraction .  Does have arthritis, no skin issues, no eye issues.   Presently consumes alcohol "socially" used to drink a lot in the past .   No past medical history on file.  Past Surgical History:  Procedure Laterality Date  . CYST REMOVAL TRUNK  1993   tailbone  . HERNIA REPAIR Bilateral    age 48 and 7 inguinal henri    Prior to Admission medications   Not on File    Family History  Problem Relation Age of Onset  . Cancer Mother        breast cancer     Social History  Substance Use Topics  . Smoking status: Current Every Day Smoker    Packs/day: 1.00    Types: Cigarettes    Start date: 12/22/2014  . Smokeless tobacco: Never Used  . Alcohol use 3.6 oz/week    6 Cans of beer per week     Comment: Whiskey on the rocks 1 glass a night     Allergies as of 07/23/2017  . (No Known  Allergies)    Review of Systems:    All systems reviewed and negative except where noted in HPI.   Physical Exam:  BP 127/78   Pulse 92   Temp 98.6 F (37 C) (Oral)   Ht 5' 7.5" (1.715 m)   Wt 196 lb (88.9 kg)   BMI 30.24 kg/m  No LMP for male patient. Psych:  Alert and cooperative. Normal mood and affect. General:   Alert,  Well-developed, well-nourished, pleasant and cooperative in NAD Head:  Normocephalic and atraumatic. Eyes:  Sclera clear, no icterus.   Conjunctiva pink. Ears:  Normal auditory acuity. Nose:  No deformity, discharge, or lesions. Mouth:  No deformity or lesions,oropharynx pink & moist. Neck:  Supple; no masses or thyromegaly. Lungs:  Respirations even and unlabored.  Clear throughout to auscultation.   No wheezes, crackles, or rhonchi. No acute distress. Heart:  Regular rate and rhythm; no murmurs, clicks, rubs, or gallops. Abdomen:  Normal bowel sounds.  No bruits.  Soft, non-tender and non-distended without masses, hepatosplenomegaly or hernias noted.  No guarding or rebound  tenderness.    Neurologic:  Alert and oriented x3;  grossly normal neurologically. Skin:  Intact without significant lesions or rashes. No jaundice. Lymph Nodes:  No significant cervical adenopathy. Psych:  Alert and cooperative. Normal mood and affect.  Imaging Studies: No results found.  Assessment and Plan:   Orie RoutJulio C Brightwell is a 46 y.o. y/o male has been referred for chronic diarrhea  I plan to rule out infection with stool tests,check TSH,celic serology, fecal calpropectin and CRP , if negative will proceed with an EGD/ colonoscopy .  I have discussed alternative options, risks & benefits,  which include, but are not limited to, bleeding, infection, perforation,respiratory complication & drug reaction.  The patient agrees with this plan & written consent will be obtained.    Follow up in 4 weeks   Dr Wyline MoodKiran Sieara Bremer MD,MRCP(U.K)

## 2017-07-24 ENCOUNTER — Other Ambulatory Visit
Admission: RE | Admit: 2017-07-24 | Discharge: 2017-07-24 | Disposition: A | Discharge status: Home / Self Care | Payer: BLUE CROSS/BLUE SHIELD | Source: Ambulatory Visit | Attending: Gastroenterology | Admitting: Gastroenterology

## 2017-07-24 DIAGNOSIS — R197 Diarrhea, unspecified: Secondary | ICD-10-CM | POA: Diagnosis not present

## 2017-07-24 LAB — GASTROINTESTINAL PANEL BY PCR, STOOL (REPLACES STOOL CULTURE)
ASTROVIRUS: NOT DETECTED
Adenovirus F40/41: NOT DETECTED
Campylobacter species: NOT DETECTED
Cryptosporidium: NOT DETECTED
Cyclospora cayetanensis: NOT DETECTED
ENTAMOEBA HISTOLYTICA: NOT DETECTED
ENTEROAGGREGATIVE E COLI (EAEC): NOT DETECTED
ENTEROPATHOGENIC E COLI (EPEC): NOT DETECTED
ENTEROTOXIGENIC E COLI (ETEC): NOT DETECTED
GIARDIA LAMBLIA: NOT DETECTED
NOROVIRUS GI/GII: NOT DETECTED
Plesimonas shigelloides: NOT DETECTED
Rotavirus A: NOT DETECTED
SAPOVIRUS (I, II, IV, AND V): NOT DETECTED
SHIGA LIKE TOXIN PRODUCING E COLI (STEC): NOT DETECTED
Salmonella species: NOT DETECTED
Shigella/Enteroinvasive E coli (EIEC): NOT DETECTED
VIBRIO CHOLERAE: NOT DETECTED
Vibrio species: NOT DETECTED
Yersinia enterocolitica: NOT DETECTED

## 2017-07-26 LAB — CELIAC PANEL 10
Antigliadin Abs, IgA: 7 units (ref 0–19)
Endomysial Ab, IgA: NEGATIVE
Gliadin IgG: 2 units (ref 0–19)
IgA: 228 mg/dL (ref 90–386)
Tissue Transglut Ab: <2 U/mL (ref 0–5)

## 2017-07-30 ENCOUNTER — Telehealth: Payer: Self-pay

## 2017-07-30 NOTE — Telephone Encounter (Signed)
-----   Message from Wyline MoodKiran Anna, MD sent at 07/29/2017 11:35 AM EST ----- Celiac serology negative

## 2017-07-30 NOTE — Telephone Encounter (Signed)
LVM for patient callback for results per Dr. Tobi BastosAnna.   Celiac serology negative

## 2017-08-03 LAB — CALPROTECTIN, FECAL: Calprotectin, Fecal: <16 ug/g (ref 0–120)

## 2017-08-05 ENCOUNTER — Telehealth: Payer: Self-pay

## 2017-08-05 NOTE — Telephone Encounter (Signed)
Attempted to contact patient. Unable to lvm on mobile. Unable LVM on home phone for callback due to mailbox being full.  Contacting patient to schedule EGD & colonoscopy.  Labs are stable and shows no infection.

## 2017-08-07 ENCOUNTER — Other Ambulatory Visit: Payer: Self-pay

## 2017-08-07 DIAGNOSIS — R197 Diarrhea, unspecified: Secondary | ICD-10-CM

## 2017-08-07 MED ORDER — PEG 3350-KCL-NA BICARB-NACL 420 G PO SOLR: 4000.0000 mL | Freq: Once | ORAL | 0 refills | Status: AC

## 2017-08-07 NOTE — Progress Notes (Signed)
Advised patient of lab results per Dr. Tobi BastosAnna.   Pt has scheduled EGD & colonoscopy for 11/30  Advised to activate MyChart. All documents online.   Sent Rx to pharmacy for prep solution.

## 2017-08-27 ENCOUNTER — Other Ambulatory Visit: Payer: Self-pay

## 2017-08-27 MED ORDER — PEG 3350-KCL-NA BICARB-NACL 420 G PO SOLR: 4000.0000 mL | Freq: Once | ORAL | 0 refills | Status: AC

## 2017-08-28 ENCOUNTER — Ambulatory Visit: Payer: BLUE CROSS/BLUE SHIELD | Admitting: Anesthesiology

## 2017-08-28 ENCOUNTER — Encounter
Admission: RE | Disposition: A | Discharge status: Home / Self Care | Payer: Self-pay | Source: Ambulatory Visit | Attending: Gastroenterology

## 2017-08-28 ENCOUNTER — Encounter: Payer: Self-pay | Admitting: *Deleted

## 2017-08-28 ENCOUNTER — Ambulatory Visit
Admission: RE | Admit: 2017-08-28 | Discharge: 2017-08-28 | Disposition: A | Discharge status: Home / Self Care | Payer: BLUE CROSS/BLUE SHIELD | Source: Ambulatory Visit | Attending: Gastroenterology | Admitting: Gastroenterology

## 2017-08-28 DIAGNOSIS — R197 Diarrhea, unspecified: Secondary | ICD-10-CM | POA: Diagnosis not present

## 2017-08-28 DIAGNOSIS — K317 Polyp of stomach and duodenum: Secondary | ICD-10-CM

## 2017-08-28 DIAGNOSIS — K3189 Other diseases of stomach and duodenum: Secondary | ICD-10-CM | POA: Diagnosis not present

## 2017-08-28 DIAGNOSIS — K219 Gastro-esophageal reflux disease without esophagitis: Secondary | ICD-10-CM | POA: Diagnosis not present

## 2017-08-28 DIAGNOSIS — K229 Disease of esophagus, unspecified: Secondary | ICD-10-CM | POA: Diagnosis not present

## 2017-08-28 DIAGNOSIS — F17210 Nicotine dependence, cigarettes, uncomplicated: Secondary | ICD-10-CM | POA: Diagnosis not present

## 2017-08-28 HISTORY — PX: ESOPHAGOGASTRODUODENOSCOPY (EGD) WITH PROPOFOL: SHX5813

## 2017-08-28 HISTORY — PX: COLONOSCOPY WITH PROPOFOL: SHX5780

## 2017-08-28 SURGERY — COLONOSCOPY WITH PROPOFOL
Anesthesia: General

## 2017-08-28 MED ORDER — MIDAZOLAM HCL 2 MG/2ML IJ SOLN
INTRAMUSCULAR | Status: AC
  Filled 2017-08-28: qty 2

## 2017-08-28 MED ORDER — PROPOFOL 10 MG/ML IV BOLUS
INTRAVENOUS | Status: AC
  Filled 2017-08-28: qty 20

## 2017-08-28 MED ORDER — MIDAZOLAM HCL 5 MG/5ML IJ SOLN
INTRAMUSCULAR | Status: DC | PRN
  Administered 2017-08-28: 1 mg via INTRAVENOUS

## 2017-08-28 MED ORDER — PROPOFOL 500 MG/50ML IV EMUL
INTRAVENOUS | Status: DC | PRN
  Administered 2017-08-28: 140 ug/kg/min via INTRAVENOUS

## 2017-08-28 MED ORDER — LACTATED RINGERS IV SOLN
INTRAVENOUS | Status: DC | PRN
  Administered 2017-08-28: 11:00:00 via INTRAVENOUS

## 2017-08-28 MED ORDER — SODIUM CHLORIDE 0.9 % IV SOLN
INTRAVENOUS | Status: DC
  Administered 2017-08-28: 10:00:00 via INTRAVENOUS

## 2017-08-28 NOTE — Anesthesia Postprocedure Evaluation (Signed)
Anesthesia Post Note  Patient: Orie RoutJulio C Chay  Procedure(s) Performed: COLONOSCOPY WITH PROPOFOL (N/A ) ESOPHAGOGASTRODUODENOSCOPY (EGD) WITH PROPOFOL (N/A )  Patient location during evaluation: Endoscopy Anesthesia Type: General Level of consciousness: awake and alert Pain management: pain level controlled Vital Signs Assessment: post-procedure vital signs reviewed and stable Respiratory status: spontaneous breathing and respiratory function stable Cardiovascular status: stable Anesthetic complications: no     Last Vitals:  Vitals:   08/28/17 0928 08/28/17 1101  BP: 119/84 96/70  Pulse: 86 82  Resp: (!) 22 16  Temp: (!) 36.3 C 36.6 C  SpO2: 100% 98%    Last Pain:  Vitals:   08/28/17 1101  TempSrc: Tympanic                 Cleburn Maiolo K

## 2017-08-28 NOTE — Anesthesia Post-op Follow-up Note (Signed)
Anesthesia QCDR form completed.        

## 2017-08-28 NOTE — Transfer of Care (Signed)
Immediate Anesthesia Transfer of Care Note  Patient: Orie RoutJulio C Kuklinski  Procedure(s) Performed: COLONOSCOPY WITH PROPOFOL (N/A ) ESOPHAGOGASTRODUODENOSCOPY (EGD) WITH PROPOFOL (N/A )  Patient Location: PACU  Anesthesia Type:General  Level of Consciousness: sedated  Airway & Oxygen Therapy: Patient Spontanous Breathing  Post-op Assessment: Report given to RN  Post vital signs: stable  Last Vitals:  Vitals:   08/28/17 0928 08/28/17 1101  BP: 119/84 96/70  Pulse: 86 82  Resp: (!) 22 16  Temp: (!) 36.3 C 36.6 C  SpO2: 100% 98%    Last Pain:  Vitals:   08/28/17 1101  TempSrc: Tympanic         Complications: No apparent anesthesia complications

## 2017-08-28 NOTE — Anesthesia Preprocedure Evaluation (Addendum)
Anesthesia Evaluation  Patient identified by MRN, date of birth, ID band Patient awake    Reviewed: Allergy & Precautions, NPO status , Patient's Chart, lab work & pertinent test results  History of Anesthesia Complications Negative for: history of anesthetic complications  Airway Mallampati: II       Dental   Pulmonary neg sleep apnea, neg COPD, Current Smoker,           Cardiovascular (-) hypertension(-) Past MI and (-) CHF (-) dysrhythmias (-) Valvular Problems/Murmurs     Neuro/Psych neg Seizures Anxiety    GI/Hepatic Neg liver ROS, GERD  Medicated,  Endo/Other  neg diabetes  Renal/GU negative Renal ROS     Musculoskeletal   Abdominal   Peds  Hematology   Anesthesia Other Findings   Reproductive/Obstetrics                            Anesthesia Physical Anesthesia Plan  ASA: II  Anesthesia Plan: General   Post-op Pain Management:    Induction: Intravenous  PONV Risk Score and Plan: 1 and Propofol infusion and TIVA  Airway Management Planned: Nasal Cannula  Additional Equipment:   Intra-op Plan:   Post-operative Plan:   Informed Consent: I have reviewed the patients History and Physical, chart, labs and discussed the procedure including the risks, benefits and alternatives for the proposed anesthesia with the patient or authorized representative who has indicated his/her understanding and acceptance.     Plan Discussed with:   Anesthesia Plan Comments:         Anesthesia Quick Evaluation

## 2017-08-28 NOTE — H&P (Signed)
Todd MoodKiran Florabelle Cardin, MD [address], [address], [place], [place], [id number] [address], [address], [place], [place], [address] Phone: [phone]  Fax: [phone]  Primary Care Physician:  Glori LuisSonnenberg, Todd G, MD   Pre-Procedure History & Physical: HPI:  Todd Miranda is a 46 y.o. male is here for an endoscopy and colonoscopy    History reviewed. No pertinent past medical history.  Past Surgical History:  Procedure Laterality Date  . CYST REMOVAL TRUNK  1993   tailbone  . HERNIA REPAIR Bilateral    age 89 and 7 inguinal henri    Prior to Admission medications   Medication Sig Start Date End Date Taking? Authorizing Provider  diphenhydrAMINE (BENADRYL) 25 mg capsule Take 25 mg by mouth at bedtime as needed for sleep.   Yes [provider]  ibuprofen (ADVIL,MOTRIN) 200 MG tablet Take 200 mg by mouth every 6 (six) hours as needed.   Yes [provider]    Allergies as of 08/07/2017  . (No Known Allergies)    Family History  Problem Relation Age of Onset  . Cancer Mother        breast cancer    Social History   Socioeconomic History  . Marital status: Single    Spouse name: Not on file  . Number of children: Not on file  . Years of education: Not on file  . Highest education level: Not on file  Social Needs  . Financial resource strain: Not on file  . Food insecurity - worry: Not on file  . Food insecurity - inability: Not on file  . Transportation needs - medical: Not on file  . Transportation needs - non-medical: Not on file  Occupational History  . Not on file  Tobacco Use  . Smoking status: Current Every Day Smoker    Packs/day: 1.00    Types: Cigarettes    Start date: 12/22/2014  . Smokeless tobacco: Never Used  Substance and Sexual Activity  . Alcohol use: Yes    Alcohol/week: 3.6 oz    Types: 6 Cans of beer per week    Comment: Whiskey on the rocks 1 glass a night   . Drug use: No  . Sexual activity: Yes    Partners: Female    Comment: 1 partner currently   Other Topics Concern  . Not on file  Social History Narrative   Works for time [employer].     Review of Systems: See HPI, otherwise negative ROS  Physical Exam: BP 119/84   Pulse 86   Temp (!) 97.4 F (36.3 C) (Tympanic)   Resp (!) 22   Ht 5\' 8"  (1.727 m)   Wt 204 lb (92.5 kg)   SpO2 100%   BMI 31.02 kg/m  General:   Alert,  pleasant and cooperative in NAD Head:  Normocephalic and atraumatic. Neck:  Supple; no masses or thyromegaly. Lungs:  Clear throughout to auscultation, normal respiratory effort.    Heart:  +S1, +S2, Regular rate and rhythm, No edema. Abdomen:  Soft, nontender and nondistended. Normal bowel sounds, without guarding, and without rebound.   Neurologic:  Alert and  oriented x4;  grossly normal neurologically.  Impression/Plan: Todd Miranda is here for an endoscopy and colonoscopy  to be performed for  evaluation of diarrhea     Risks, benefits, limitations, and alternatives regarding endoscopy have been reviewed with the patient.  Questions have been answered.  All parties agreeable.   Todd MoodKiran Phebe Dettmer, MD  08/28/2017, 10:22 AM

## 2017-08-28 NOTE — Op Note (Signed)
[facility] Gastroenterology Patient Name: Todd Miranda Procedure Date: 08/28/2017 10:33 AM MRN: [id number] Account #: [id number] Date of Birth: 11-02-1970 Admit Type: Outpatient Age: 46 Room: [facility] ENDO ROOM 4 Gender: Male Note Status: Finalized Procedure:            Colonoscopy Indications:          Clinically significant diarrhea of unexplained origin Providers:            Wyline Mood MD, MD Referring MD:         Yehuda Mao. Birdie Sons (Referring MD) Medicines:            Monitored Anesthesia Care Complications:        No immediate complications. Procedure:            Pre-Anesthesia Assessment:                       - Prior to the procedure, a History and Physical was                        performed, and patient medications, allergies and                        sensitivities were reviewed. The patient's tolerance of                        previous anesthesia was reviewed.                       - The risks and benefits of the procedure and the                        sedation options and risks were discussed with the                        patient. All questions were answered and informed                        consent was obtained.                       - ASA Grade Assessment: II - A patient with mild                        systemic disease.                       After obtaining informed consent, the colonoscope was                        passed under direct vision. Throughout the procedure,                        the patient's blood pressure, pulse, and oxygen                        saturations were monitored continuously. The                        Colonoscope was introduced through the anus and  advanced to the the terminal ileum. The colonoscopy was                        performed without difficulty. The patient tolerated the                        procedure well. The quality of the bowel preparation                        was  inadequate. Findings:      A large amount of semi-liquid stool was found in the entire colon,       making visualization difficult.      The exam was otherwise without abnormality on direct and retroflexion       views.      The terminal ileum appeared normal. Biopsies were taken with a cold       forceps for histology. Impression:           - Preparation of the colon was inadequate.                       - Stool in the entire examined colon.                       - The examination was otherwise normal on direct and                        retroflexion views.                       - The examined portion of the ileum was normal.                        Biopsied. Recommendation:       - Discharge patient to home (with escort).                       - Resume previous diet.                       - Continue present medications.                       - Await pathology results.                       - The prep was inadequate to asses for polyps. Mucosa                        was grossly appearing normal. Repeat colonoscopy for                        colorectal cancer screening at age 39 unless a family                        member has colon polyps or cancer then would need to be                        repeated in 3 months . Procedure Code(s):    --- Professional ---  [id number], Colonoscopy, flexible; with biopsy, single or                        multiple Diagnosis Code(s):    --- Professional ---                       R19.7, Diarrhea, unspecified CPT copyright 2016 American Medical Association. All rights reserved. The codes documented in this report are preliminary and upon coder review may  be revised to meet current compliance requirements. Wyline MoodKiran Jamiaya Bina, MD Wyline MoodKiran Asti Mackley MD, MD 08/28/2017 10:58:19 AM This report has been signed electronically. Number of Addenda: 0 Note Initiated On: 08/28/2017 10:33 AM Scope Withdrawal Time: 0 hours 5 minutes 43 seconds  Total Procedure  Duration: 0 hours 7 minutes 33 seconds       [facility]

## 2017-08-28 NOTE — Addendum Note (Signed)
Addendum  created 08/28/17 1617 by Naomie DeanKephart, William K, MD   Charge Capture section accepted

## 2017-08-28 NOTE — Op Note (Signed)
[facility] Gastroenterology Patient Name: Todd HarderJulio Miranda Procedure Date: 08/28/2017 10:34 AM MRN: [id number] Account #: [id number] Date of Birth: 1971-01-24 Admit Type: Outpatient Age: [age] [facility] 4 Gender: Male Note Status: Finalized Procedure:            Upper GI endoscopy Indications:          Diarrhea Providers:            Wyline MoodKiran Babak Lucus MD, MD Referring MD:         Yehuda MaoEric G. Birdie SonsSonnenberg (Referring MD) Medicines:            Monitored Anesthesia Care Complications:        No immediate complications. Procedure:            Pre-Anesthesia Assessment:                       - Prior to the procedure, a History and Physical was                        performed, and patient medications, allergies and                        sensitivities were reviewed. The patient's tolerance of                        previous anesthesia was reviewed.                       - The risks and benefits of the procedure and the                        sedation options and risks were discussed with the                        patient. All questions were answered and informed                        consent was obtained.                       - ASA Grade Assessment: II - A patient with mild                        systemic disease.                       After obtaining informed consent, the endoscope was                        passed under direct vision. Throughout the procedure,                        the patient's blood pressure, pulse, and oxygen                        saturations were monitored continuously. The Endoscope                        was introduced through the mouth, and advanced to the  third part of duodenum. The upper GI endoscopy was                        accomplished with ease. The patient tolerated the                        procedure well. Findings:      Two tongues of salmon-colored mucosa were present. No other visible       abnormalities  were present. The maximum longitudinal extent of these       esophageal mucosal changes was 0.5 cm in length. Biopsies were taken       with a cold forceps for histology.      Two 3 to 5 mm sessile polyps with no stigmata of recent bleeding were       found in the gastric antrum. The polyp was removed with a cold biopsy       forceps. Resection and retrieval were complete.      The in the duodenum was normal. Biopsies for histology were taken with a       cold forceps for evaluation of celiac disease. Impression:           - Salmon-colored mucosa suspicious for Barrett's                        esophagus. Biopsied.                       - Two gastric polyps. Resected and retrieved.                       - Normal. Biopsied. Recommendation:       - Await pathology results.                       - Perform a colonoscopy today. Procedure Code(s):    --- Professional ---                       [id number], Esophagogastroduodenoscopy, flexible, transoral;                        with biopsy, single or multiple Diagnosis Code(s):    --- Professional ---                       K22.8, Other specified diseases of esophagus                       R19.7, Diarrhea, unspecified                       K31.7, Polyp of stomach and duodenum CPT copyright 2016 American Medical Association. All rights reserved. The codes documented in this report are preliminary and upon coder review may  be revised to meet current compliance requirements. Wyline MoodKiran Jinger Middlesworth, MD Wyline MoodKiran Donnel Venuto MD, MD 08/28/2017 10:46:19 AM This report has been signed electronically. Number of Addenda: 0 Note Initiated On: 08/28/2017 10:34 AM      [facility]

## 2017-08-31 ENCOUNTER — Encounter: Payer: Self-pay | Admitting: Gastroenterology

## 2017-08-31 LAB — SURGICAL PATHOLOGY

## 2017-09-16 ENCOUNTER — Encounter: Payer: Self-pay | Admitting: Family Medicine

## 2017-09-16 ENCOUNTER — Telehealth: Payer: Self-pay

## 2017-09-16 DIAGNOSIS — K2270 Barrett's esophagus without dysplasia: Secondary | ICD-10-CM | POA: Insufficient documentation

## 2017-09-16 NOTE — Telephone Encounter (Signed)
LVM for pt to contact office regarding lab results.  Thanks [employer]

## 2017-12-16 ENCOUNTER — Ambulatory Visit: Payer: BLUE CROSS/BLUE SHIELD | Admitting: Family Medicine

## 2018-05-26 ENCOUNTER — Telehealth: Payer: Self-pay

## 2018-05-26 NOTE — Telephone Encounter (Signed)
Copied from CRM [id number]. Topic: Appointment Scheduling - Scheduling Inquiry for Clinic >> May 26, 2018  5:01 PM Lorrine Kin, [place] wrote: Reason for CRM: Patient calling and states that he needs a CPE. Offered him the first available (09/20/18) and he states that he needs his CPE before 07/22/18 because that is his deal line for work. Please advise. CB#:[phone]

## 2018-05-27 NOTE — Telephone Encounter (Signed)
See below

## 2018-06-01 NOTE — Telephone Encounter (Signed)
Pt is scheduled. Thank you.

## 2018-07-09 ENCOUNTER — Ambulatory Visit (INDEPENDENT_AMBULATORY_CARE_PROVIDER_SITE_OTHER): Payer: BLUE CROSS/BLUE SHIELD | Admitting: Family Medicine

## 2018-07-09 ENCOUNTER — Encounter: Payer: Self-pay | Admitting: Family Medicine

## 2018-07-09 ENCOUNTER — Other Ambulatory Visit (HOSPITAL_COMMUNITY)
Admission: RE | Admit: 2018-07-09 | Discharge: 2018-07-09 | Disposition: A | Discharge status: Home / Self Care | Payer: BLUE CROSS/BLUE SHIELD | Source: Ambulatory Visit | Attending: Family Medicine | Admitting: Family Medicine

## 2018-07-09 VITALS — BP 120/78 | HR 81 | Temp 98.5°F | Ht 68.0 in | Wt 198.4 lb

## 2018-07-09 DIAGNOSIS — Z8619 Personal history of other infectious and parasitic diseases: Secondary | ICD-10-CM | POA: Diagnosis not present

## 2018-07-09 DIAGNOSIS — Z13220 Encounter for screening for lipoid disorders: Secondary | ICD-10-CM

## 2018-07-09 DIAGNOSIS — M79641 Pain in right hand: Secondary | ICD-10-CM

## 2018-07-09 DIAGNOSIS — K429 Umbilical hernia without obstruction or gangrene: Secondary | ICD-10-CM

## 2018-07-09 DIAGNOSIS — E669 Obesity, unspecified: Secondary | ICD-10-CM | POA: Diagnosis not present

## 2018-07-09 DIAGNOSIS — Z0001 Encounter for general adult medical examination with abnormal findings: Secondary | ICD-10-CM | POA: Diagnosis not present

## 2018-07-09 DIAGNOSIS — B009 Herpesviral infection, unspecified: Secondary | ICD-10-CM

## 2018-07-09 DIAGNOSIS — E66811 Obesity, class 1: Secondary | ICD-10-CM

## 2018-07-09 DIAGNOSIS — K2270 Barrett's esophagus without dysplasia: Secondary | ICD-10-CM

## 2018-07-09 LAB — COMPREHENSIVE METABOLIC PANEL
ALBUMIN: 4.5 g/dL (ref 3.5–5.2)
ALK PHOS: 62 U/L (ref 39–117)
ALT: 42 U/L (ref 0–53)
AST: 24 U/L (ref 0–37)
BUN: 16 mg/dL (ref 6–23)
CALCIUM: 9.9 mg/dL (ref 8.4–10.5)
CHLORIDE: 103 meq/L (ref 96–112)
CO2: 29 mEq/L (ref 19–32)
Creatinine, Ser: 1 mg/dL (ref 0.40–1.50)
GFR: 85.57 mL/min (ref >60.00)
Glucose, Bld: 92 mg/dL (ref 70–99)
POTASSIUM: 4.7 meq/L (ref 3.5–5.1)
Sodium: 140 mEq/L (ref 135–145)
TOTAL PROTEIN: 7.1 g/dL (ref 6.0–8.3)
Total Bilirubin: 0.5 mg/dL (ref 0.2–1.2)

## 2018-07-09 LAB — LIPID PANEL
CHOLESTEROL: 253 mg/dL — AB (ref 0–200)
HDL: 66.6 mg/dL (ref >39.00)
LDL CALC: 158 mg/dL — AB (ref 0–99)
NonHDL: 186.1
TRIGLYCERIDES: 139 mg/dL (ref 0.0–149.0)
Total CHOL/HDL Ratio: 4
VLDL: 27.8 mg/dL (ref 0.0–40.0)

## 2018-07-09 LAB — HEMOGLOBIN A1C: HEMOGLOBIN A1C: 5.2 % (ref 4.6–6.5)

## 2018-07-09 NOTE — Patient Instructions (Signed)
Nice to see you. We will check lab work today and contact you with the results. Please try to stay active and monitor your diet. We will get you to see orthopedics. Please consider taking omeprazole for your intermittent heartburn.

## 2018-07-09 NOTE — Progress Notes (Signed)
[name], MD Phone: [phone]  Todd Miranda is a 47 y.o. male who presents today for cpe.  Exercise: Does a physically active job. Diet is much better than previously.  Eating lots of vegetables and salads.  His dietary changes have helped with his prior diarrhea. No family history of colon cancer or polyps.  No family history of prostate cancer. Reports possibly having had HIV screening previously though he would be interested in screening now.  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  He is interested in evaluation for other STDs. Declines flu shot.  Tetanus vaccination up-to-date. Patient quit smoking last year.  Its difficult for him to quantify how much he smoked in the past.  He has cut down his alcohol use to infrequently.  No illicit drug use. Has not seen a dentist in some time.  He sees an ophthalmologist once yearly. His loose stools have improved quite a bit.  He notes dietary changes have helped with that.  He did have an EGD with Barrett's esophagus.  He does report some intermittent reflux.  He is not on medication for this. Patient does report intermittent right-sided pinky tingling that has been going on for a long time.  He feels it is related to his arthritis.  He does note some pain in his joints in his right pinky.  He notes he is starting to feel similar symptoms in his left middle finger distally. Patient has an umbilical hernia.  It fully reduces.  Rare discomfort in it.  He saw a surgeon previously and has deferred treatment for this.   Active Ambulatory Problems    Diagnosis Date Noted  . Fatigue 01/31/2013  . Anxiety 12/19/2013  . Depression with anxiety 01/16/2014  . Sleep disturbance 02/27/2014  . Obesity, BMI unknown 10/02/2014  . HSV-1 infection 04/23/2015  . Encounter for general adult medical examination with abnormal findings 01/14/2016  . Umbilical hernia 38/88/2800  . Hyperlipidemia 04/14/2016  . Diarrhea 09/02/2016  . Right hip pain 06/18/2017    . Barrett esophagus 09/16/2017  . Right hand pain 07/10/2018   Resolved Ambulatory Problems    Diagnosis Date Noted  . General medical exam 01/31/2013  . Wrist pain, right 12/19/2013  . Pain, wrist 01/16/2014   No Additional Past Medical History    Family History  Problem Relation Age of Onset  . Cancer Mother        breast cancer    Social History   Socioeconomic History  . Marital status: Single    Spouse name: Not on file  . Number of children: Not on file  . Years of education: Not on file  . Highest education level: Not on file  Occupational History  . Not on file  Social Needs  . Financial resource strain: Not on file  . Food insecurity:    Worry: Not on file    Inability: Not on file  . Transportation needs:    Medical: Not on file    Non-medical: Not on file  Tobacco Use  . Smoking status: Former Smoker    Packs/day: 1.00    Types: Cigarettes    Start date: 12/22/2014  . Smokeless tobacco: Never Used  Substance and Sexual Activity  . Alcohol use: Yes    Alcohol/week: 6.0 standard drinks    Types: 6 Cans of beer per week    Comment: Whiskey on the rocks 1 glass a night   . Drug use: No  . Sexual activity: Yes  Partners: Female    Comment: 1 partner currently   Lifestyle  . Physical activity:    Days per week: Not on file    Minutes per session: Not on file  . Stress: Not on file  Relationships  . Social connections:    Talks on phone: Not on file    Gets together: Not on file    Attends religious service: Not on file    Active member of club or organization: Not on file    Attends meetings of clubs or organizations: Not on file    Relationship status: Not on file  . Intimate partner violence:    Fear of current or ex partner: Not on file    Emotionally abused: Not on file    Physically abused: Not on file    Forced sexual activity: Not on file  Other Topics Concern  . Not on file  Social History Narrative   Works for time Comptroller.      ROS  General:  Negative for nexplained weight loss, fever Skin: Negative for new or changing mole, sore that won't heal HEENT: Negative for trouble hearing, trouble seeing, ringing in ears, mouth sores, hoarseness, change in voice, dysphagia. CV:  Negative for chest pain, dyspnea, edema, palpitations Resp: Negative for cough, dyspnea, hemoptysis GI: Negative for nausea, vomiting, diarrhea, constipation, abdominal pain, melena, hematochezia. GU: Negative for dysuria, incontinence, urinary hesitance, hematuria, vaginal or penile discharge, polyuria, sexual difficulty, lumps in testicle or breasts MSK: Negative for muscle cramps or aches, joint pain or swelling Neuro: Negative for headaches, weakness, numbness, dizziness, passing out/fainting Psych: Negative for depression, anxiety, memory problems  Objective  Physical Exam Vitals:   07/09/18 1114  BP: 120/78  Pulse: 81  Temp: 98.5 F (36.9 C)  SpO2: 96%    BP Readings from Last 3 Encounters:  07/09/18 120/78  08/28/17 95/84  07/23/17 127/78   Wt Readings from Last 3 Encounters:  07/09/18 198 lb 6.4 oz (90 kg)  08/28/17 204 lb (92.5 kg)  07/23/17 196 lb (88.9 kg)    Physical Exam  Constitutional: No distress.  HENT:  Head: Normocephalic and atraumatic.  Mouth/Throat: Oropharynx is clear and moist.  Eyes: Pupils are equal, round, and reactive to light. Conjunctivae are normal.  Neck: Neck supple.  Cardiovascular: Normal rate, regular rhythm and normal heart sounds.  Pulmonary/Chest: Effort normal and breath sounds normal.  Abdominal: Soft. Bowel sounds are normal. He exhibits no distension. There is no tenderness. There is no rebound and no guarding. A hernia (Umbilical hernia that is fully reducible with no tenderness) is present.  Musculoskeletal: He exhibits no edema.  No tenderness or swelling in his fingers, there is nodule and slight deviation of the DIP joint in his right hand pinky finger  Lymphadenopathy:     He has no cervical adenopathy.  Neurological: He is alert.  5/5 strength bilateral biceps, triceps, handgrip, sensation light touch intact bilateral upper extremities positive Tinel's at the right elbow ulnar groove  Skin: Skin is warm and dry. He is not diaphoretic.  Psychiatric: He has a normal mood and affect.     Assessment/Plan:   Encounter for general adult medical examination with abnormal findings Physical exam completed.  Encourage staying active and monitoring his diet.  Offered follow-up with GI for his loose stools though he deferred.  STD screening as outlined below.  Other lab work as well as outlined below.  Barrett esophagus Advised he would need a repeat EGD in 3  years.  Discussed trial of Prilosec for his reflux.  HSV-1 infection STD screening as outlined below.  Umbilical hernia Discussed hernia return precautions.  Right hand pain Refer to orthopedics given potential for ulnar nerve entrapment at his elbow.   Orders Placed This Encounter  Procedures  . Comp Met (CMET)  . Lipid panel  . HgB A1c  . HIV antibody (with reflex)  . RPR  . HSV(herpes simplex vrs) 1+2 ab-IgG  . Ambulatory referral to Orthopedic Surgery    Referral Priority:   Routine    Referral Type:   Surgical    Referral Reason:   Specialty Services Required    Requested Specialty:   Orthopedic Surgery    Number of Visits Requested:   1    Meds ordered this encounter  Medications  . omeprazole (PRILOSEC) 20 MG capsule    Sig: Take 1 capsule (20 mg total) by mouth daily.    Dispense:  30 capsule    Refill:  [place], MD [place]

## 2018-07-10 ENCOUNTER — Encounter: Payer: Self-pay | Admitting: Family Medicine

## 2018-07-10 DIAGNOSIS — M79641 Pain in right hand: Secondary | ICD-10-CM | POA: Insufficient documentation

## 2018-07-10 MED ORDER — OMEPRAZOLE 20 MG PO CPDR: 20.0000 mg | DELAYED_RELEASE_CAPSULE | Freq: Every day | ORAL | 1 refills | Status: DC

## 2018-07-10 NOTE — Assessment & Plan Note (Signed)
Refer to orthopedics given potential for ulnar nerve entrapment at his elbow.

## 2018-07-10 NOTE — Assessment & Plan Note (Signed)
Discussed hernia return precautions.

## 2018-07-10 NOTE — Assessment & Plan Note (Signed)
STD screening as outlined below.

## 2018-07-10 NOTE — Assessment & Plan Note (Addendum)
Advised he would need a repeat EGD in 3 years.  Discussed trial of Prilosec for his reflux.

## 2018-07-10 NOTE — Assessment & Plan Note (Signed)
Physical exam completed.  Encourage staying active and monitoring his diet.  Offered follow-up with GI for his loose stools though he deferred.  STD screening as outlined below.  Other lab work as well as outlined below.

## 2018-07-12 LAB — HIV ANTIBODY (ROUTINE TESTING W REFLEX): HIV: NONREACTIVE

## 2018-07-12 LAB — RPR: RPR: NONREACTIVE

## 2018-07-12 LAB — URINE CYTOLOGY ANCILLARY ONLY
CHLAMYDIA, DNA PROBE: NEGATIVE
Neisseria Gonorrhea: NEGATIVE

## 2018-07-12 LAB — HSV(HERPES SIMPLEX VRS) I + II AB-IGG: HSV 2 IGG, TYPE SPEC: 4.77 {index} — AB

## 2019-01-04 ENCOUNTER — Telehealth: Payer: Self-pay

## 2019-01-04 NOTE — Telephone Encounter (Signed)
Pt advised will come into the office tomorrow.    COVID-19 questions:  1 have you traveled outside the country or been on a plane in the past 3 months? NO  2. Have you been around anyone who has tested postive for COVID-19? NO  3. Have you have a cough, fever, or SOB? NO

## 2019-01-04 NOTE — Telephone Encounter (Signed)
Sent to PCP to advise what you would like for the patient to do.    Virtual Visit Office visit  Urgent care   Thanks   if pt comes into the office will ask the necessary COVID-19 questions to insure pt is not ill

## 2019-01-04 NOTE — Telephone Encounter (Signed)
I am fine with him coming to the office though he will need to be screened for COVID19 with questionnaire first.

## 2019-01-04 NOTE — Telephone Encounter (Signed)
Copied from CRM [id number]. Topic: Appointment Scheduling - Scheduling Inquiry for Clinic >> Jan 04, 2019  7:28 AM Crist Infante wrote: Reason for CRM: pt wants to know if there is any way possible he can see the dr for his hip pain?  Pt states he is really unbearable and he doesn't see how an appt virtual will work for him.

## 2019-01-05 ENCOUNTER — Ambulatory Visit: Payer: BLUE CROSS/BLUE SHIELD | Admitting: Family Medicine

## 2019-01-05 ENCOUNTER — Encounter: Payer: Self-pay | Admitting: Family Medicine

## 2019-01-05 ENCOUNTER — Other Ambulatory Visit: Payer: Self-pay

## 2019-01-05 ENCOUNTER — Ambulatory Visit (INDEPENDENT_AMBULATORY_CARE_PROVIDER_SITE_OTHER): Payer: BLUE CROSS/BLUE SHIELD

## 2019-01-05 VITALS — BP 104/62 | HR 74 | Temp 98.1°F | Ht 68.0 in | Wt 216.0 lb

## 2019-01-05 DIAGNOSIS — M25551 Pain in right hip: Secondary | ICD-10-CM | POA: Diagnosis not present

## 2019-01-05 NOTE — Progress Notes (Signed)
[name], MD Phone: [phone]  Todd Miranda is a 48 y.o. male who presents today for same day visit.   Right hip pain: Patient notes this has been going on for 3 to 4 weeks.  He notes he turned over in bed and noted a sharp pain in his right hip.  Notes that it occurs laterally and sometimes occurs posteriorly in his buttocks.  Notes it does radiate down the side to just above his knee.  No numbness or weakness.  No incontinence.  No injury.  He notes lifting and moving the leg does hurt at times.  He has taken some ibuprofen up to 800 mg in a day which does help somewhat with his symptoms.  He has had no fevers.  Social History   Tobacco Use  Smoking Status Former Smoker  . Packs/day: 1.00  . Types: Cigarettes  . Start date: 12/22/2014  Smokeless Tobacco Never Used     ROS see history of present illness  Objective  Physical Exam Vitals:   01/05/19 0904  BP: 104/62  Pulse: 74  Temp: 98.1 F (36.7 C)  SpO2: 98%    BP Readings from Last 3 Encounters:  01/05/19 104/62  07/09/18 120/78  08/28/17 95/84   Wt Readings from Last 3 Encounters:  01/05/19 216 lb (98 kg)  07/09/18 198 lb 6.4 oz (90 kg)  08/28/17 204 lb (92.5 kg)    Physical Exam Constitutional:      General: He is not in acute distress.    Appearance: He is not diaphoretic.  Cardiovascular:     Rate and Rhythm: Normal rate and regular rhythm.     Heart sounds: Normal heart sounds.  Pulmonary:     Effort: Pulmonary effort is normal.     Breath sounds: Normal breath sounds.  Musculoskeletal:     Comments: No midline spine tenderness, no midline spine step-off, no muscular back tenderness, no tenderness over his buttocks, no tenderness over his lateral hip, he has full range of motion internally and externally in bilateral hips, he has discomfort on internal range of motion of the right hip  Skin:    General: Skin is warm and dry.  Neurological:     Mental Status: He is alert.     Comments:  5/5 strength bilateral quads, hamstrings, plantar flexion, and dorsiflexion, sensation to light touch intact bilateral lower extremities, 2+ patellar reflexes      Assessment/Plan: Please see individual problem list.  Right hip pain Patient with right hip pain.  Does not seem to be related to his back at this time.  We will get an x-ray of his right hip.  He will continue ibuprofen.  Consider orthopedic or PT referral after x-ray returns.   Orders Placed This Encounter  Procedures  . DG Hip Unilat W OR W/O Pelvis 2-3 Views Right    Standing Status:   Future    Standing Expiration Date:   03/06/2020    Order Specific Question:   Reason for Exam (SYMPTOM  OR DIAGNOSIS REQUIRED)    Answer:   right hip pain radiates down lateral thigh, no injury    Order Specific Question:   Preferred imaging location?    Answer:   [facility] Specific Question:   Radiology Contrast Protocol - do NOT remove file path    Answer:   [email or web address]    No orders of the defined types were placed in this encounter.    [name],  MD [place]

## 2019-01-05 NOTE — Patient Instructions (Signed)
Nice to see you. We will get an x-ray of your hip today. You can continue as needed ibuprofen though you need to take this with food. We will contact you when your x-ray returns.

## 2019-01-05 NOTE — Assessment & Plan Note (Signed)
Patient with right hip pain.  Does not seem to be related to his back at this time.  We will get an x-ray of his right hip.  He will continue ibuprofen.  Consider orthopedic or PT referral after x-ray returns.

## 2019-01-06 ENCOUNTER — Other Ambulatory Visit: Payer: Self-pay | Admitting: Family Medicine

## 2019-01-06 DIAGNOSIS — M25551 Pain in right hip: Secondary | ICD-10-CM

## 2019-01-31 DIAGNOSIS — S76911A Strain of unspecified muscles, fascia and tendons at thigh level, right thigh, initial encounter: Secondary | ICD-10-CM | POA: Diagnosis not present

## 2019-02-03 DIAGNOSIS — M25551 Pain in right hip: Secondary | ICD-10-CM | POA: Diagnosis not present

## 2019-02-03 DIAGNOSIS — S76011D Strain of muscle, fascia and tendon of right hip, subsequent encounter: Secondary | ICD-10-CM | POA: Diagnosis not present

## 2019-02-15 DIAGNOSIS — S76011D Strain of muscle, fascia and tendon of right hip, subsequent encounter: Secondary | ICD-10-CM | POA: Diagnosis not present

## 2019-02-15 DIAGNOSIS — M25551 Pain in right hip: Secondary | ICD-10-CM | POA: Diagnosis not present

## 2019-02-17 DIAGNOSIS — S76011D Strain of muscle, fascia and tendon of right hip, subsequent encounter: Secondary | ICD-10-CM | POA: Diagnosis not present

## 2019-02-17 DIAGNOSIS — M25551 Pain in right hip: Secondary | ICD-10-CM | POA: Diagnosis not present

## 2019-02-23 DIAGNOSIS — S76011D Strain of muscle, fascia and tendon of right hip, subsequent encounter: Secondary | ICD-10-CM | POA: Diagnosis not present

## 2019-02-23 DIAGNOSIS — M25551 Pain in right hip: Secondary | ICD-10-CM | POA: Diagnosis not present

## 2019-02-25 DIAGNOSIS — S76011D Strain of muscle, fascia and tendon of right hip, subsequent encounter: Secondary | ICD-10-CM | POA: Diagnosis not present

## 2019-02-25 DIAGNOSIS — M25551 Pain in right hip: Secondary | ICD-10-CM | POA: Diagnosis not present

## 2019-02-28 DIAGNOSIS — M5416 Radiculopathy, lumbar region: Secondary | ICD-10-CM | POA: Diagnosis not present

## 2019-03-04 DIAGNOSIS — S76011D Strain of muscle, fascia and tendon of right hip, subsequent encounter: Secondary | ICD-10-CM | POA: Diagnosis not present

## 2019-03-04 DIAGNOSIS — G5701 Lesion of sciatic nerve, right lower limb: Secondary | ICD-10-CM | POA: Diagnosis not present

## 2019-03-04 DIAGNOSIS — M25551 Pain in right hip: Secondary | ICD-10-CM | POA: Diagnosis not present

## 2019-03-11 DIAGNOSIS — S76011D Strain of muscle, fascia and tendon of right hip, subsequent encounter: Secondary | ICD-10-CM | POA: Diagnosis not present

## 2019-03-11 DIAGNOSIS — M25551 Pain in right hip: Secondary | ICD-10-CM | POA: Diagnosis not present

## 2019-03-16 DIAGNOSIS — S76011D Strain of muscle, fascia and tendon of right hip, subsequent encounter: Secondary | ICD-10-CM | POA: Diagnosis not present

## 2019-03-16 DIAGNOSIS — M25551 Pain in right hip: Secondary | ICD-10-CM | POA: Diagnosis not present

## 2019-03-18 DIAGNOSIS — S76011D Strain of muscle, fascia and tendon of right hip, subsequent encounter: Secondary | ICD-10-CM | POA: Diagnosis not present

## 2019-03-18 DIAGNOSIS — M25551 Pain in right hip: Secondary | ICD-10-CM | POA: Diagnosis not present

## 2019-03-18 DIAGNOSIS — M5416 Radiculopathy, lumbar region: Secondary | ICD-10-CM | POA: Diagnosis not present

## 2019-03-21 DIAGNOSIS — M25551 Pain in right hip: Secondary | ICD-10-CM | POA: Diagnosis not present

## 2019-03-31 ENCOUNTER — Other Ambulatory Visit: Payer: Self-pay | Admitting: Orthopedic Surgery

## 2019-03-31 DIAGNOSIS — M25551 Pain in right hip: Secondary | ICD-10-CM

## 2019-04-15 ENCOUNTER — Ambulatory Visit: Payer: BC Managed Care – PPO

## 2019-04-15 ENCOUNTER — Ambulatory Visit: Admission: RE | Admit: 2019-04-15 | Payer: BC Managed Care – PPO | Source: Ambulatory Visit

## 2019-07-08 ENCOUNTER — Other Ambulatory Visit: Payer: Self-pay

## 2019-07-11 ENCOUNTER — Ambulatory Visit (INDEPENDENT_AMBULATORY_CARE_PROVIDER_SITE_OTHER): Payer: BC Managed Care – PPO | Admitting: Family Medicine

## 2019-07-11 ENCOUNTER — Encounter: Payer: Self-pay | Admitting: Family Medicine

## 2019-07-11 ENCOUNTER — Other Ambulatory Visit: Payer: Self-pay

## 2019-07-11 VITALS — BP 119/70 | HR 89 | Temp 97.9°F | Ht 67.0 in | Wt 214.0 lb

## 2019-07-11 DIAGNOSIS — Z6833 Body mass index (BMI) 33.0-33.9, adult: Secondary | ICD-10-CM | POA: Diagnosis not present

## 2019-07-11 DIAGNOSIS — E785 Hyperlipidemia, unspecified: Secondary | ICD-10-CM | POA: Diagnosis not present

## 2019-07-11 DIAGNOSIS — E6609 Other obesity due to excess calories: Secondary | ICD-10-CM

## 2019-07-11 DIAGNOSIS — Z0000 Encounter for general adult medical examination without abnormal findings: Secondary | ICD-10-CM

## 2019-07-11 NOTE — Patient Instructions (Signed)
Nice to see you. We will get labs today. 

## 2019-07-11 NOTE — Progress Notes (Signed)
[name], MD Phone: [phone]  Todd Miranda is a 48 y.o. male who presents today for CPE.  Diet: This is much healthier.  Has been cooking a lot more.  He cut red meat down to 1-2 times a month.  No soda or sweet tea.  Eating lots of fruits and vegetables. Exercise: He walks a lot at work and also walks about 45 minutes daily with his dog. No family history of prostate cancer or colon cancer. He declines a flu vaccine.  Tetanus vaccine up-to-date. HIV screening up-to-date. Former smoker.  He quit 2 years ago.  Typically smoked half a pack a day.  He has 1-2 alcoholic beverages weekly.  No illicit drug use. Has not seen a dentist in a couple of years.  He does follow with an ophthalmologist yearly. He is following with orthopedics for a right hip issue.  They have referred him to a hip specialist in [place].  Active Ambulatory Problems    Diagnosis Date Noted  . Fatigue 01/31/2013  . Anxiety 12/19/2013  . Depression with anxiety 01/16/2014  . Sleep disturbance 02/27/2014  . Obesity, BMI unknown 10/02/2014  . HSV-1 infection 04/23/2015  . Routine general medical examination at a health care facility 01/14/2016  . Umbilical hernia 40/04/6760  . Hyperlipidemia 04/14/2016  . Diarrhea 09/02/2016  . Right hip pain 06/18/2017  . Barrett esophagus 09/16/2017  . Right hand pain 07/10/2018   Resolved Ambulatory Problems    Diagnosis Date Noted  . General medical exam 01/31/2013  . Wrist pain, right 12/19/2013  . Pain, wrist 01/16/2014   No Additional Past Medical History    Family History  Problem Relation Age of Onset  . Cancer Mother        breast cancer    Social History   Socioeconomic History  . Marital status: Single    Spouse name: Not on file  . Number of children: Not on file  . Years of education: Not on file  . Highest education level: Not on file  Occupational History  . Not on file  Social Needs  . Financial resource strain: Not on file  .  Food insecurity    Worry: Not on file    Inability: Not on file  . Transportation needs    Medical: Not on file    Non-medical: Not on file  Tobacco Use  . Smoking status: Former Smoker    Packs/day: 1.00    Types: Cigarettes    Start date: 12/22/2014  . Smokeless tobacco: Never Used  Substance and Sexual Activity  . Alcohol use: Yes    Alcohol/week: 6.0 standard drinks    Types: 6 Cans of beer per week    Comment: Whiskey on the rocks 1 glass a night   . Drug use: No  . Sexual activity: Yes    Partners: Female    Comment: 1 partner currently   Lifestyle  . Physical activity    Days per week: Not on file    Minutes per session: Not on file  . Stress: Not on file  Relationships  . Social Herbalist on phone: Not on file    Gets together: Not on file    Attends religious service: Not on file    Active member of club or organization: Not on file    Attends meetings of clubs or organizations: Not on file    Relationship status: Not on file  . Intimate partner violence  Fear of current or ex partner: Not on file    Emotionally abused: Not on file    Physically abused: Not on file    Forced sexual activity: Not on file  Other Topics Concern  . Not on file  Social History Narrative   Works for time [employer].     ROS  General:  Negative for nexplained weight loss, fever Skin: Negative for new or changing mole, sore that won't heal HEENT: Negative for trouble hearing, trouble seeing, ringing in ears, mouth sores, hoarseness, change in voice, dysphagia. CV:  Negative for chest pain, dyspnea, edema, palpitations Resp: Negative for cough, dyspnea, hemoptysis GI: Negative for nausea, vomiting, diarrhea, constipation, abdominal pain, melena, hematochezia. GU: Negative for dysuria, incontinence, urinary hesitance, hematuria, vaginal or penile discharge, polyuria, sexual difficulty, lumps in testicle or breasts MSK: positive for muscle cramps or aches, joint pain  or swelling Neuro: Negative for headaches, weakness, numbness, dizziness, passing out/fainting Psych: Negative for depression, anxiety, memory problems  Objective  Physical Exam Vitals:   07/11/19 1438  BP: 119/70  Pulse: 89  Temp: 97.9 F (36.6 C)  SpO2: 97%    BP Readings from Last 3 Encounters:  07/11/19 119/70  01/05/19 104/62  07/09/18 120/78   Wt Readings from Last 3 Encounters:  07/11/19 214 lb (97.1 kg)  01/05/19 216 lb (98 kg)  07/09/18 198 lb 6.4 oz (90 kg)    Physical Exam Constitutional:      General: He is not in acute distress.    Appearance: He is not diaphoretic.  HENT:     Head: Normocephalic and atraumatic.  Eyes:     Conjunctiva/sclera: Conjunctivae normal.     Pupils: Pupils are equal, round, and reactive to light.  Cardiovascular:     Rate and Rhythm: Normal rate and regular rhythm.     Heart sounds: Normal heart sounds.  Pulmonary:     Effort: Pulmonary effort is normal.     Breath sounds: Normal breath sounds.  Abdominal:     General: Bowel sounds are normal. There is no distension.     Palpations: Abdomen is soft.     Tenderness: There is no abdominal tenderness. There is no guarding or rebound.  Musculoskeletal:     Right lower leg: No edema.     Left lower leg: No edema.  Lymphadenopathy:     Cervical: No cervical adenopathy.  Skin:    General: Skin is warm and dry.  Neurological:     Mental Status: He is alert.  Psychiatric:        Mood and Affect: Mood normal.      Assessment/Plan:   Routine general medical examination at a health care facility Physical exam completed.  I encouraged continued diet and exercise.  I encouraged flu vaccine though he declined.  I encouraged him to see a dentist as well.  He will continue to see his orthopedist for his hip.  Lab work today.   Orders Placed This Encounter  Procedures  . Comp Met (CMET)  . Lipid panel  . HgB A1c    No orders of the defined types were placed in this  encounter.    [name], MD [place]

## 2019-07-11 NOTE — Assessment & Plan Note (Signed)
Physical exam completed.  I encouraged continued diet and exercise.  I encouraged flu vaccine though he declined.  I encouraged him to see a dentist as well.  He will continue to see his orthopedist for his hip.  Lab work today.

## 2019-07-12 LAB — COMPREHENSIVE METABOLIC PANEL
ALT: 33 U/L (ref 0–53)
AST: 19 U/L (ref 0–37)
Albumin: 4.3 g/dL (ref 3.5–5.2)
Alkaline Phosphatase: 73 U/L (ref 39–117)
BUN: 12 mg/dL (ref 6–23)
CO2: 30 mEq/L (ref 19–32)
Calcium: 9.4 mg/dL (ref 8.4–10.5)
Chloride: 102 mEq/L (ref 96–112)
Creatinine, Ser: 0.96 mg/dL (ref 0.40–1.50)
GFR: 84.03 mL/min (ref >60.00)
Glucose, Bld: 79 mg/dL (ref 70–99)
Potassium: 4.7 mEq/L (ref 3.5–5.1)
Sodium: 139 mEq/L (ref 135–145)
Total Bilirubin: 0.4 mg/dL (ref 0.2–1.2)
Total Protein: 6.3 g/dL (ref 6.0–8.3)

## 2019-07-12 LAB — LIPID PANEL
Cholesterol: 238 mg/dL — ABNORMAL HIGH (ref 0–200)
HDL: 51.7 mg/dL (ref >39.00)
LDL Cholesterol: 147 mg/dL — ABNORMAL HIGH (ref 0–99)
NonHDL: 186.69
Total CHOL/HDL Ratio: 5
Triglycerides: 200 mg/dL — ABNORMAL HIGH (ref 0.0–149.0)
VLDL: 40 mg/dL (ref 0.0–40.0)

## 2019-07-12 LAB — HEMOGLOBIN A1C: Hgb A1c MFr Bld: 5.5 % (ref 4.6–6.5)

## 2019-10-05 ENCOUNTER — Telehealth: Payer: Self-pay | Admitting: Family Medicine

## 2019-10-05 NOTE — Telephone Encounter (Signed)
Pt called about receiving a bill regarding lab. Pt was charged for A1C due to coding on appt 07/11/2019. Please advise and Thank you!

## 2019-10-06 NOTE — Telephone Encounter (Signed)
I will forward to E[place] Noe to reach out to the patient. I do not handle billing issues. The A1c was coded for obesity which is an appropriate code and this is a recommended lab test for patients with obesity.

## 2019-10-13 NOTE — Telephone Encounter (Signed)
Hi Todd Miranda,  This was coded correctly according to dx on lab order (obesity).  [employer] is denying the A1C as non covered.  I pulled up their policy.  It states they will cover the A1C once every 3 years with obesity over certain BMI and with at least one of the specified risk factors stated in policy.  I am thinking it was denied since they just had an A1C lab test in 2018, 2019, and again in 2020.  I am going to email you the [employer] A1C policy.  Thanks, Marcelino Duster

## 2019-10-13 NOTE — Telephone Encounter (Signed)
I will ask Melvenia Beam @ [employer] to follow up on this to confirm reason for denial.  Thanks

## 2019-10-13 NOTE — Telephone Encounter (Signed)
Hello Todd Miranda,   Can you review the coding on this patient ? He states the  insurance is not paying due to coding.

## 2019-10-24 ENCOUNTER — Telehealth: Payer: Self-pay | Admitting: Family Medicine

## 2019-10-24 ENCOUNTER — Ambulatory Visit: Payer: BC Managed Care – PPO | Attending: Internal Medicine

## 2019-10-24 DIAGNOSIS — Z20822 Contact with and (suspected) exposure to covid-19: Secondary | ICD-10-CM | POA: Diagnosis not present

## 2019-10-24 NOTE — Telephone Encounter (Signed)
error 

## 2019-10-24 NOTE — Telephone Encounter (Signed)
I left the patient a message per the coding and billing department.   Coding reviewed the following account and because the pt doesn't have a DM Dx then the A1C can be reimbursed once every 3 yrs because the pt has other risk factors.        The DX coding is correct per lab orders but not supported by payer policy Hemoglobin A1c [facility] - G2006

## 2019-10-25 LAB — NOVEL CORONAVIRUS, NAA: SARS-CoV-2, NAA: NOT DETECTED

## 2019-10-25 NOTE — Telephone Encounter (Signed)
I called the patient back to inform him that the coding could not be changed due to the documentation by the physician stating that the A1C was done due to obesity. The patient was very understanding .

## 2019-11-15 ENCOUNTER — Telehealth: Payer: BC Managed Care – PPO | Admitting: Family Medicine

## 2019-11-15 DIAGNOSIS — K6289 Other specified diseases of anus and rectum: Secondary | ICD-10-CM | POA: Diagnosis not present

## 2019-12-12 ENCOUNTER — Ambulatory Visit: Payer: BC Managed Care – PPO | Admitting: Family Medicine

## 2019-12-16 ENCOUNTER — Telehealth: Payer: BC Managed Care – PPO | Admitting: Family Medicine

## 2019-12-31 IMAGING — DX DG HIP (WITH OR WITHOUT PELVIS) 2-3V RIGHT
3 series · 3 of 3 positions shown · non-contrast
Comparison: None.

CLINICAL DATA: Acute right hip pain without known injury.

EXAM:
DG HIP (WITH OR WITHOUT PELVIS) 2-3V RIGHT

[pelvis ap]
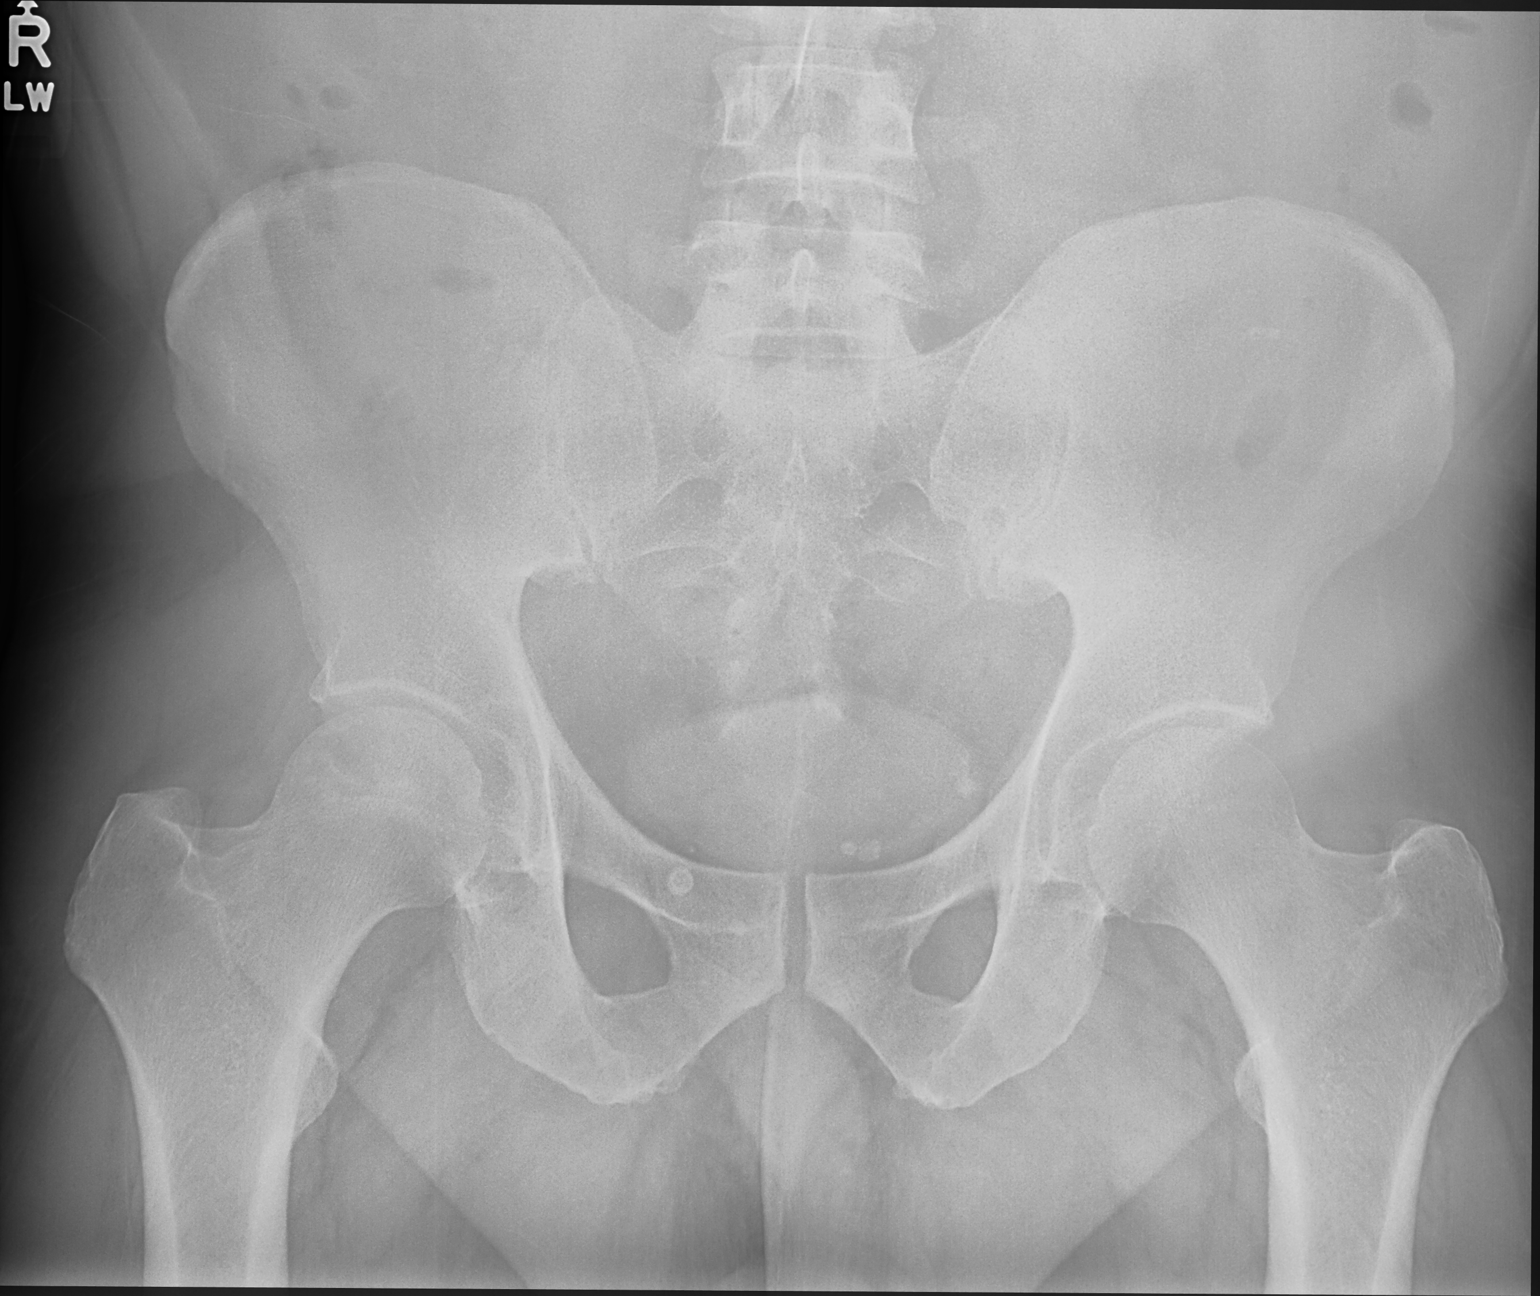

[hip joint ap]
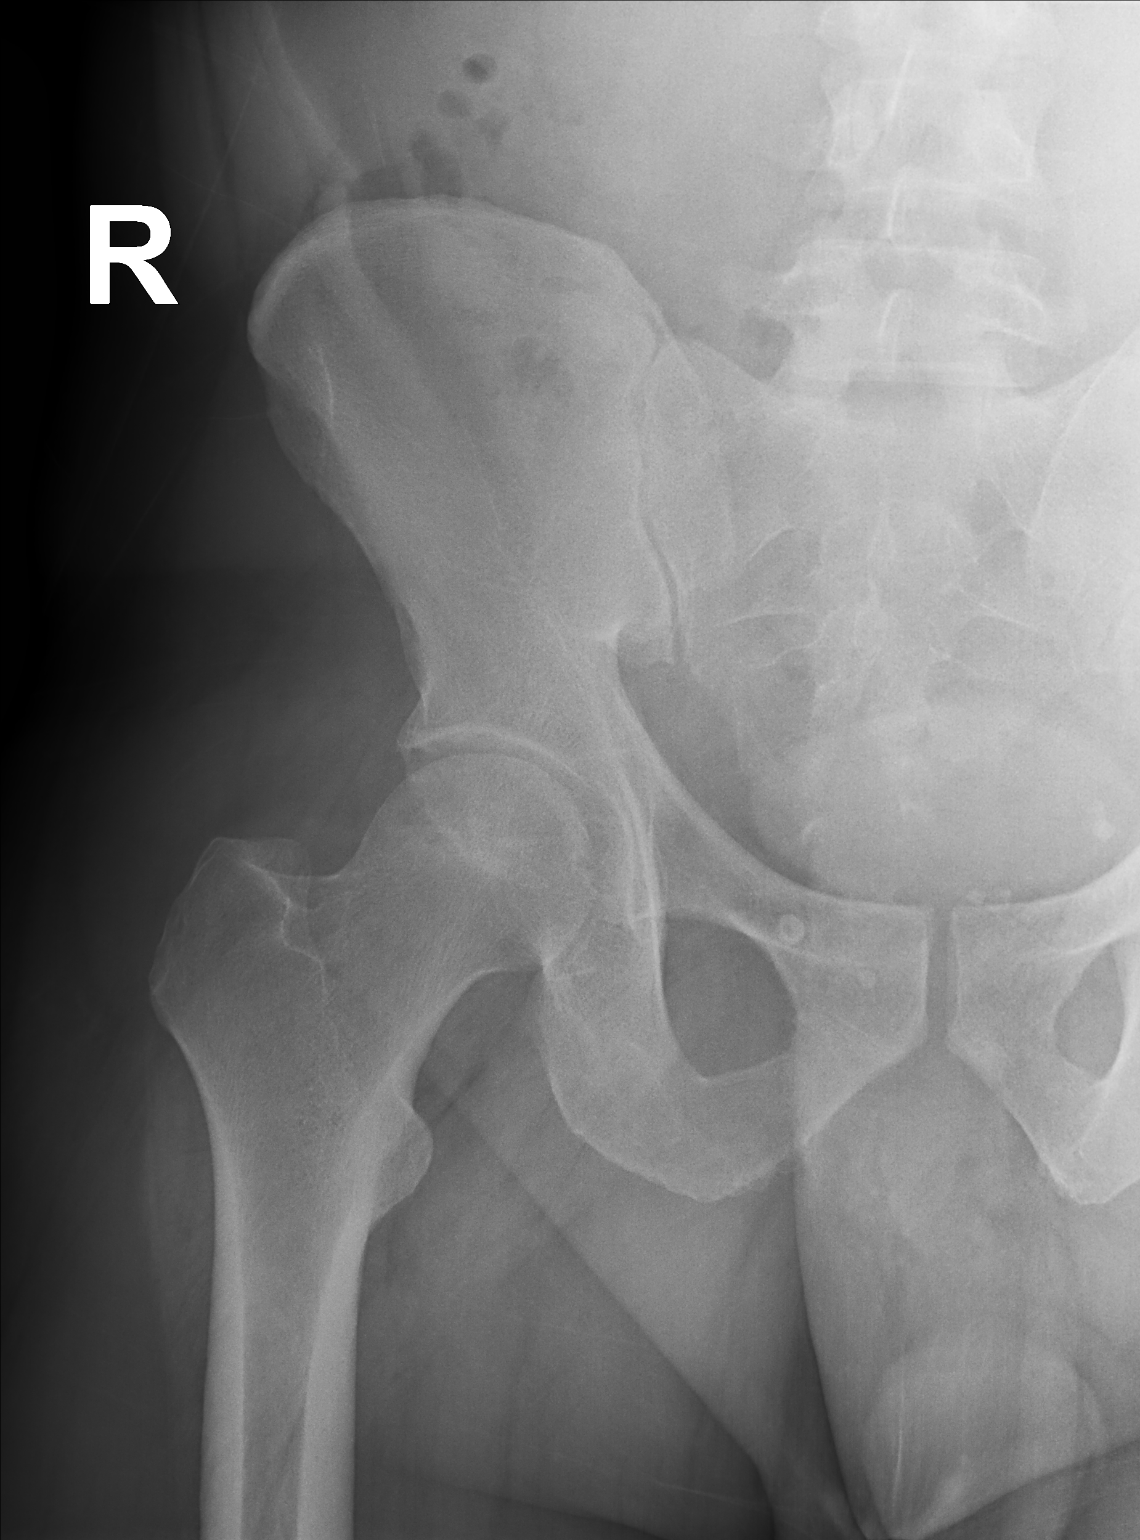

[ap frog obl (oblique)]
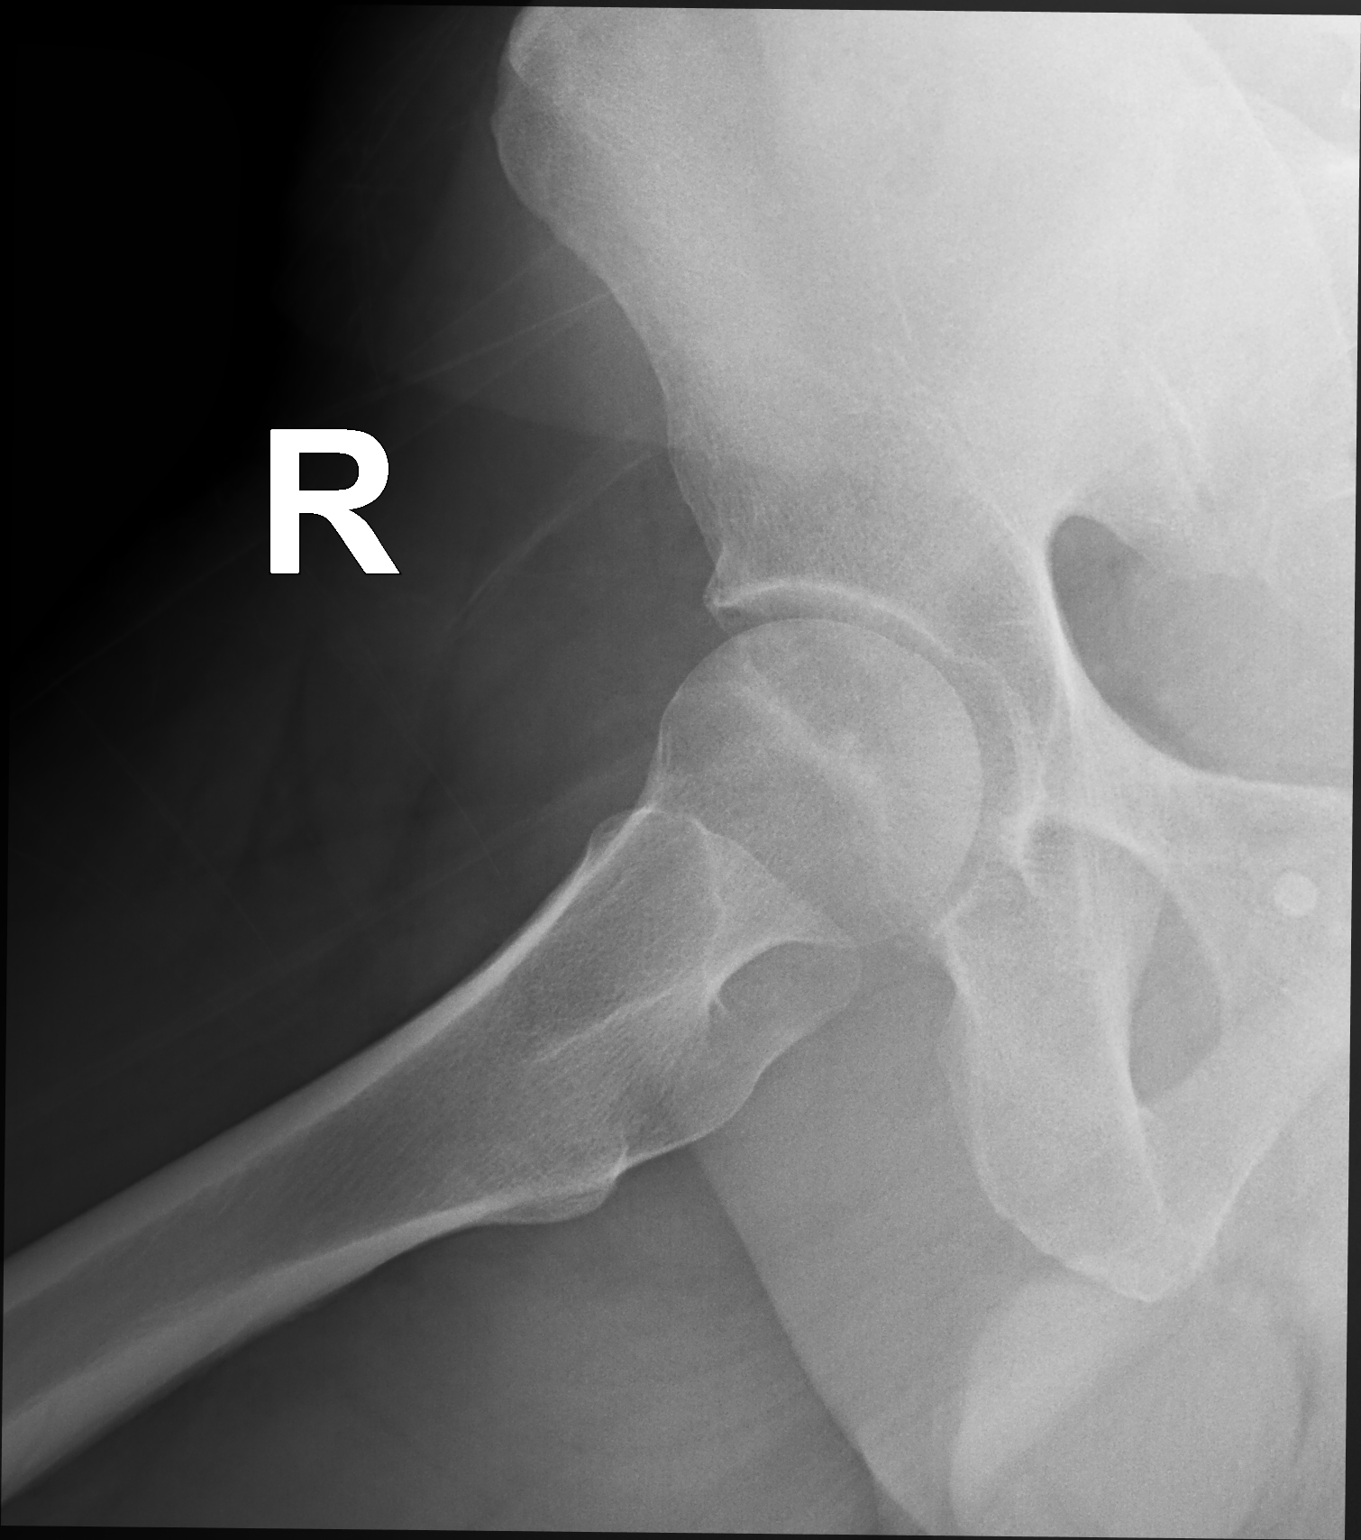

[3 of 3 positions shown; findings below may reference images not displayed]

FINDINGS: There is no evidence of hip fracture or dislocation. There is no
evidence of arthropathy or other focal bone abnormality.
IMPRESSION: Negative.

## 2020-02-09 DIAGNOSIS — K429 Umbilical hernia without obstruction or gangrene: Secondary | ICD-10-CM | POA: Diagnosis not present

## 2020-02-09 DIAGNOSIS — M25551 Pain in right hip: Secondary | ICD-10-CM | POA: Diagnosis not present

## 2020-03-12 DIAGNOSIS — M25551 Pain in right hip: Secondary | ICD-10-CM | POA: Diagnosis not present

## 2020-06-04 DIAGNOSIS — Z0000 Encounter for general adult medical examination without abnormal findings: Secondary | ICD-10-CM | POA: Diagnosis not present

## 2020-06-04 DIAGNOSIS — M25551 Pain in right hip: Secondary | ICD-10-CM | POA: Diagnosis not present

## 2020-06-11 DIAGNOSIS — M1611 Unilateral primary osteoarthritis, right hip: Secondary | ICD-10-CM | POA: Diagnosis not present

## 2020-06-11 DIAGNOSIS — M25551 Pain in right hip: Secondary | ICD-10-CM | POA: Diagnosis not present

## 2020-07-06 ENCOUNTER — Telehealth: Payer: Self-pay

## 2020-07-06 NOTE — Telephone Encounter (Signed)
Pt said he no longer comes to our office. He now goes to [facility]. I removed Dr. Birdie Sons as pcp and cancelled his appointment for 07/11/20.

## 2020-07-11 ENCOUNTER — Encounter: Payer: BC Managed Care – PPO | Admitting: Family Medicine
